# Patient Record
Sex: Female | Born: 1970 | Race: Black or African American | Hispanic: No | Marital: Single | Smoking: Never smoker
Health system: Southern US, Community
[De-identification: ages and names within clinical notes are randomized; demographics above are authoritative.]

## PROBLEM LIST (undated history)

## (undated) DIAGNOSIS — Z8639 Personal history of other endocrine, nutritional and metabolic disease: Secondary | ICD-10-CM

## (undated) DIAGNOSIS — M1990 Unspecified osteoarthritis, unspecified site: Secondary | ICD-10-CM

## (undated) DIAGNOSIS — D649 Anemia, unspecified: Secondary | ICD-10-CM

## (undated) HISTORY — PX: OTHER SURGICAL HISTORY: SHX169

## (undated) HISTORY — DX: Unspecified osteoarthritis, unspecified site: M19.90

## (undated) HISTORY — PX: LAPAROSCOPIC GASTRIC SLEEVE RESECTION: SHX5895

## (undated) HISTORY — DX: Personal history of other endocrine, nutritional and metabolic disease: Z86.39

## (undated) HISTORY — PX: WISDOM TOOTH EXTRACTION: SHX21

---

## 2002-02-22 ENCOUNTER — Emergency Department (HOSPITAL_COMMUNITY): Admission: EM | Admit: 2002-02-22 | Discharge: 2002-02-22 | Payer: Self-pay | Admitting: *Deleted

## 2004-02-07 ENCOUNTER — Other Ambulatory Visit: Admission: RE | Admit: 2004-02-07 | Discharge: 2004-02-07 | Payer: Self-pay | Admitting: Obstetrics & Gynecology

## 2006-01-15 ENCOUNTER — Inpatient Hospital Stay (HOSPITAL_COMMUNITY): Admission: AD | Admit: 2006-01-15 | Discharge: 2006-01-17 | Payer: Self-pay | Admitting: Obstetrics and Gynecology

## 2006-04-30 ENCOUNTER — Emergency Department (HOSPITAL_COMMUNITY): Admission: EM | Admit: 2006-04-30 | Discharge: 2006-04-30 | Payer: Self-pay | Admitting: Emergency Medicine

## 2009-09-20 ENCOUNTER — Encounter (INDEPENDENT_AMBULATORY_CARE_PROVIDER_SITE_OTHER): Payer: Self-pay | Admitting: *Deleted

## 2011-01-02 ENCOUNTER — Emergency Department (INDEPENDENT_AMBULATORY_CARE_PROVIDER_SITE_OTHER): Payer: 59

## 2011-01-02 ENCOUNTER — Emergency Department (HOSPITAL_BASED_OUTPATIENT_CLINIC_OR_DEPARTMENT_OTHER)
Admission: EM | Admit: 2011-01-02 | Discharge: 2011-01-02 | Disposition: A | Discharge status: Home / Self Care | Payer: 59 | Attending: Emergency Medicine | Admitting: Emergency Medicine

## 2011-01-02 DIAGNOSIS — Y93E1 Activity, personal bathing and showering: Secondary | ICD-10-CM | POA: Insufficient documentation

## 2011-01-02 DIAGNOSIS — M25569 Pain in unspecified knee: Secondary | ICD-10-CM

## 2011-01-02 DIAGNOSIS — W010XXA Fall on same level from slipping, tripping and stumbling without subsequent striking against object, initial encounter: Secondary | ICD-10-CM | POA: Insufficient documentation

## 2011-01-02 DIAGNOSIS — Y92009 Unspecified place in unspecified non-institutional (private) residence as the place of occurrence of the external cause: Secondary | ICD-10-CM | POA: Insufficient documentation

## 2011-01-02 DIAGNOSIS — IMO0002 Reserved for concepts with insufficient information to code with codable children: Secondary | ICD-10-CM | POA: Insufficient documentation

## 2012-05-01 IMAGING — CR DG KNEE COMPLETE 4+V*L*
4 series · 4 of 4 positions shown · non-contrast
Comparison: None.

CLINICAL DATA: Fall

LEFT KNEE - COMPLETE 4+ VIEW

[t knee oblique left (1 of 2)]
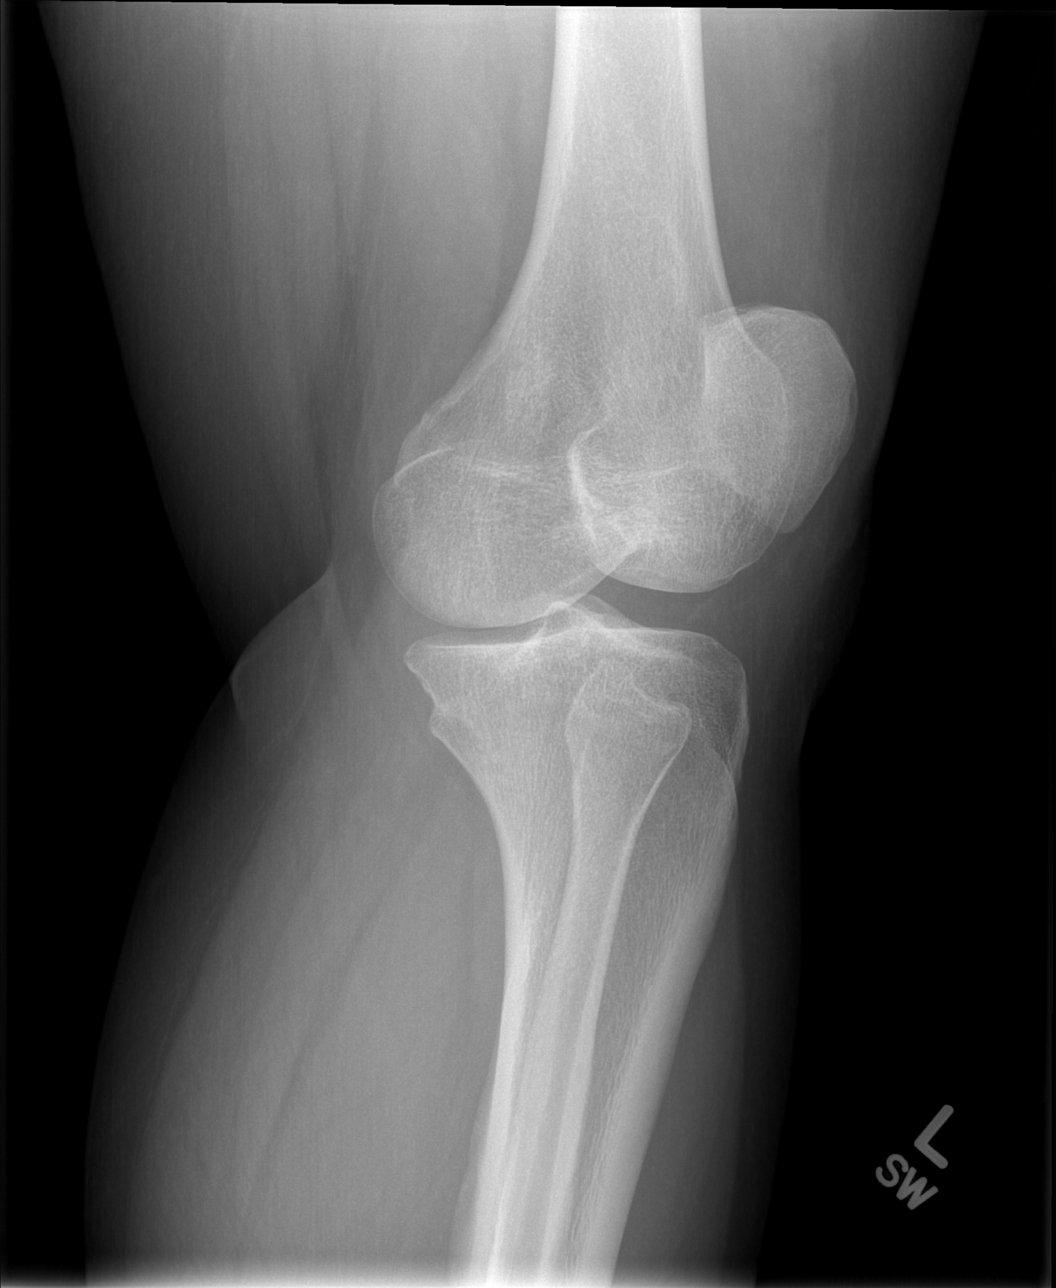

[t knee oblique left (2 of 2)]
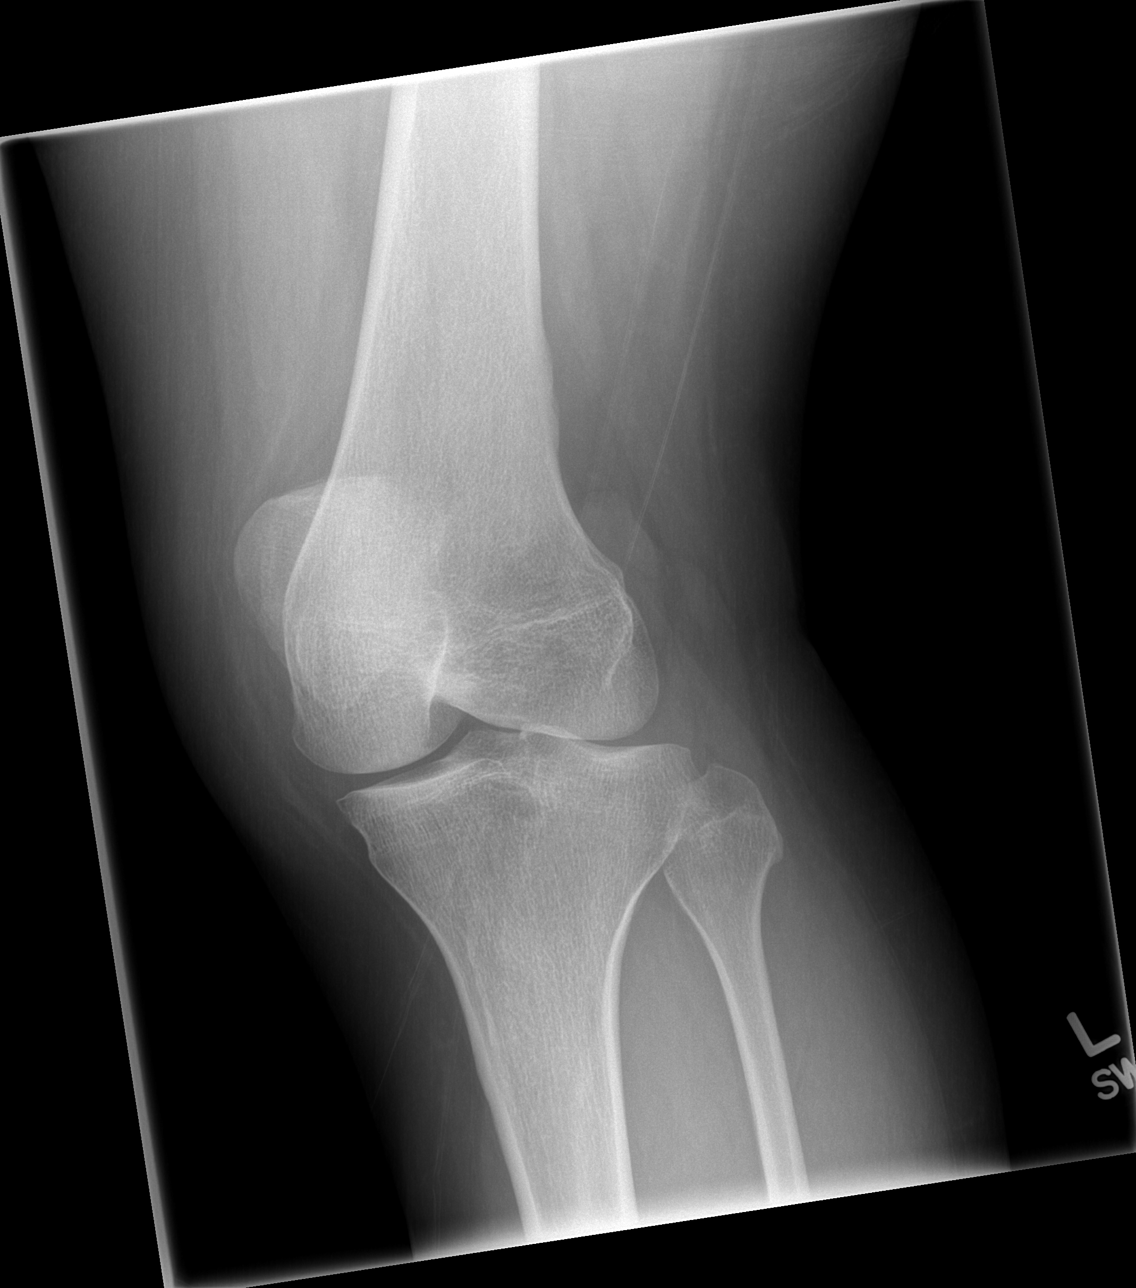

[t knee ap left]
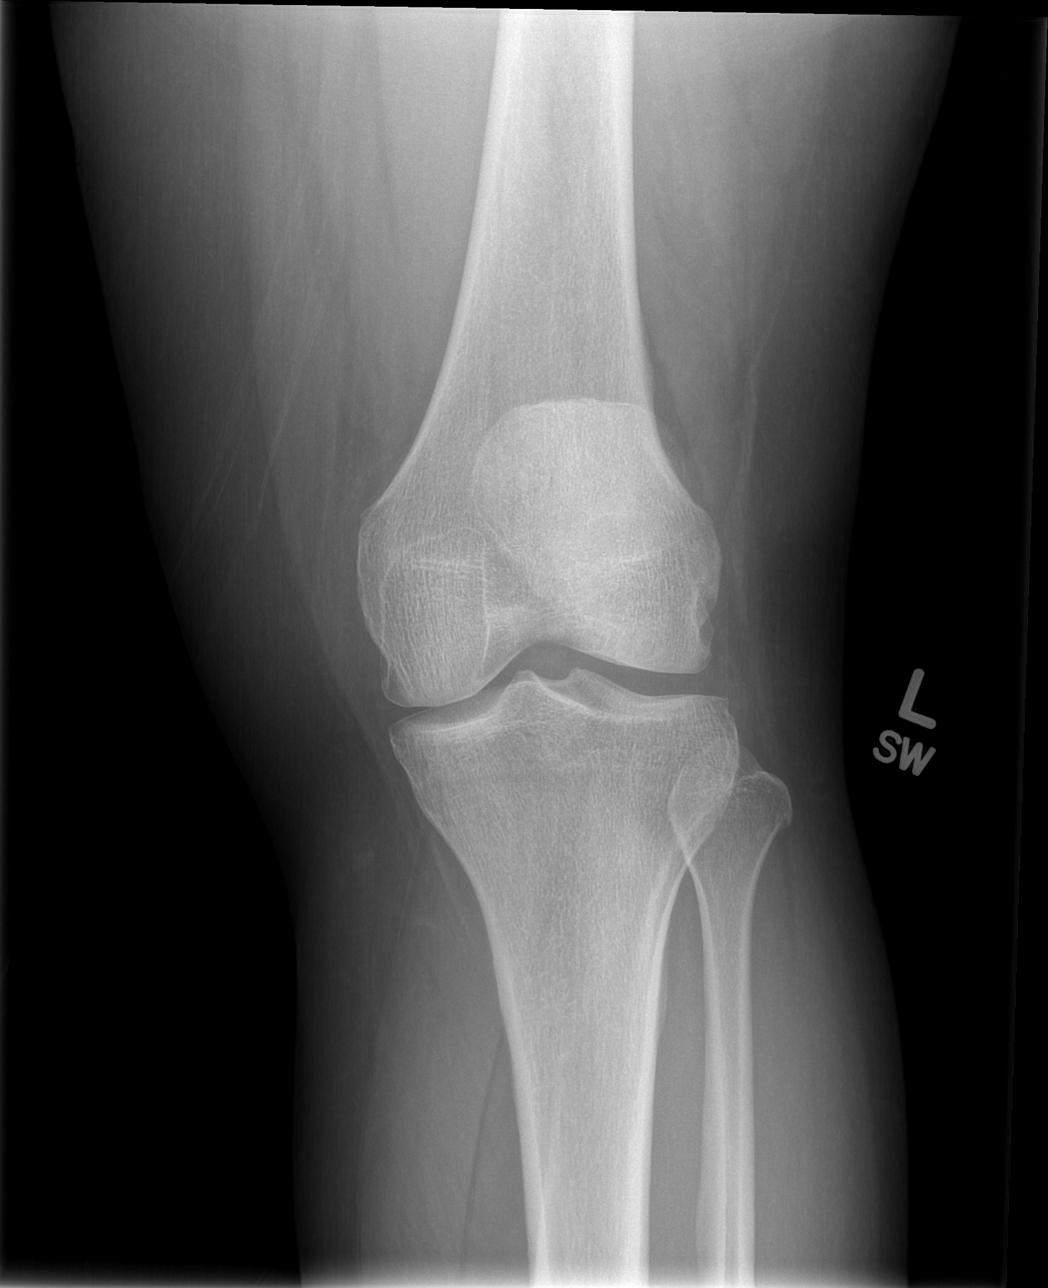

[t knee lat left]
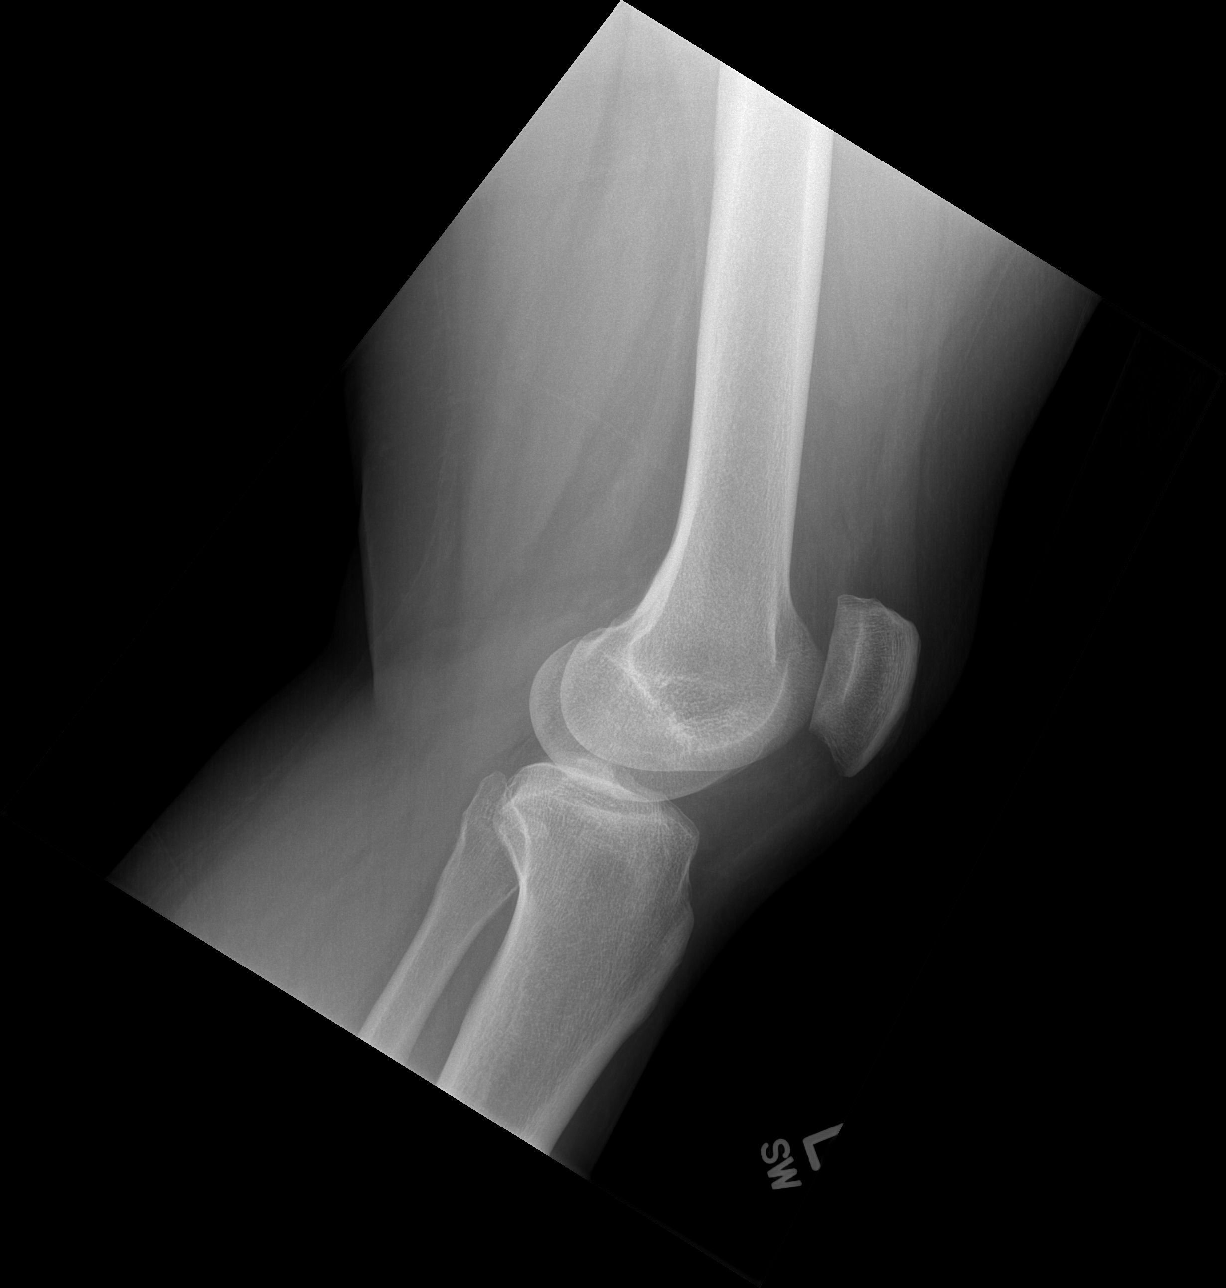

[4 of 4 positions shown; findings below may reference images not displayed]

FINDINGS: The knee is located.  Joint spaces are maintained.  No
fracture, focal bony abnormality, or significant degenerative
change.  No evidence of joint effusion or focal soft tissue
swelling.
IMPRESSION: No acute bony abnormality.

## 2014-06-27 ENCOUNTER — Other Ambulatory Visit: Payer: Self-pay | Admitting: Obstetrics and Gynecology

## 2014-07-01 ENCOUNTER — Encounter (HOSPITAL_COMMUNITY): Payer: Self-pay | Admitting: Pharmacist

## 2014-07-04 ENCOUNTER — Encounter (HOSPITAL_COMMUNITY): Payer: Self-pay | Admitting: *Deleted

## 2014-07-14 ENCOUNTER — Inpatient Hospital Stay (HOSPITAL_COMMUNITY)
Admission: AD | Admit: 2014-07-14 | Discharge: 2014-07-14 | Disposition: A | Discharge status: Home / Self Care | Payer: 59 | Source: Ambulatory Visit | Attending: Obstetrics and Gynecology | Admitting: Obstetrics and Gynecology

## 2014-07-14 ENCOUNTER — Encounter (HOSPITAL_COMMUNITY)
Admission: RE | Disposition: A | Discharge status: Home / Self Care | Payer: Self-pay | Source: Ambulatory Visit | Attending: Obstetrics and Gynecology

## 2014-07-14 ENCOUNTER — Ambulatory Visit (HOSPITAL_COMMUNITY)
Admission: RE | Admit: 2014-07-14 | Discharge: 2014-07-14 | Disposition: A | Discharge status: Home / Self Care | Payer: 59 | Source: Ambulatory Visit | Attending: Obstetrics and Gynecology | Admitting: Obstetrics and Gynecology

## 2014-07-14 ENCOUNTER — Ambulatory Visit (HOSPITAL_COMMUNITY): Payer: 59 | Admitting: Certified Registered Nurse Anesthetist

## 2014-07-14 ENCOUNTER — Encounter (HOSPITAL_COMMUNITY): Payer: Self-pay | Admitting: *Deleted

## 2014-07-14 ENCOUNTER — Encounter (HOSPITAL_COMMUNITY): Payer: 59 | Admitting: Certified Registered Nurse Anesthetist

## 2014-07-14 ENCOUNTER — Encounter (HOSPITAL_COMMUNITY): Payer: Self-pay | Admitting: Anesthesiology

## 2014-07-14 DIAGNOSIS — Z302 Encounter for sterilization: Secondary | ICD-10-CM | POA: Diagnosis present

## 2014-07-14 DIAGNOSIS — Z5309 Procedure and treatment not carried out because of other contraindication: Secondary | ICD-10-CM | POA: Insufficient documentation

## 2014-07-14 DIAGNOSIS — D649 Anemia, unspecified: Secondary | ICD-10-CM | POA: Diagnosis present

## 2014-07-14 DIAGNOSIS — Z9884 Bariatric surgery status: Secondary | ICD-10-CM | POA: Insufficient documentation

## 2014-07-14 DIAGNOSIS — D509 Iron deficiency anemia, unspecified: Secondary | ICD-10-CM | POA: Diagnosis not present

## 2014-07-14 HISTORY — DX: Anemia, unspecified: D64.9

## 2014-07-14 LAB — IRON AND TIBC
IRON: 10 ug/dL — AB (ref 42–135)
Saturation Ratios: 2 % — ABNORMAL LOW (ref 20–55)
TIBC: 492 ug/dL — AB (ref 250–470)
UIBC: 482 ug/dL — ABNORMAL HIGH (ref 125–400)

## 2014-07-14 LAB — CBC
HEMATOCRIT: 23.5 % — AB (ref 36.0–46.0)
HEMATOCRIT: 24.1 % — AB (ref 36.0–46.0)
Hemoglobin: 6.6 g/dL — CL (ref 12.0–15.0)
Hemoglobin: 6.7 g/dL — CL (ref 12.0–15.0)
MCH: 17.7 pg — ABNORMAL LOW (ref 26.0–34.0)
MCH: 17.6 pg — AB (ref 26.0–34.0)
MCHC: 28.1 g/dL — AB (ref 30.0–36.0)
MCHC: 27.8 g/dL — AB (ref 30.0–36.0)
MCV: 63.2 fL — AB (ref 78.0–100.0)
MCV: 63.4 fL — ABNORMAL LOW (ref 78.0–100.0)
Platelets: 390 10*3/uL (ref 150–400)
Platelets: 416 10*3/uL — ABNORMAL HIGH (ref 150–400)
RBC: 3.72 MIL/uL — ABNORMAL LOW (ref 3.87–5.11)
RBC: 3.8 MIL/uL — ABNORMAL LOW (ref 3.87–5.11)
RDW: 18.5 % — ABNORMAL HIGH (ref 11.5–15.5)
RDW: 18.5 % — AB (ref 11.5–15.5)
WBC: 5.4 10*3/uL (ref 4.0–10.5)
WBC: 5.3 10*3/uL (ref 4.0–10.5)

## 2014-07-14 LAB — VITAMIN B12: Vitamin B-12: 288 pg/mL (ref 211–911)

## 2014-07-14 LAB — FERRITIN: Ferritin: <1 ng/mL — ABNORMAL LOW (ref 10–291)

## 2014-07-14 LAB — RETICULOCYTES
RBC.: 3.8 MIL/uL — AB (ref 3.87–5.11)
Retic Count, Absolute: 34.2 10*3/uL (ref 19.0–186.0)
Retic Ct Pct: 0.9 % (ref 0.4–3.1)

## 2014-07-14 LAB — PREGNANCY, URINE: Preg Test, Ur: NEGATIVE

## 2014-07-14 SURGERY — LIGATION, FALLOPIAN TUBE, LAPAROSCOPIC
Anesthesia: General | Laterality: Bilateral

## 2014-07-14 MED ORDER — BUPIVACAINE HCL (PF) 0.25 % IJ SOLN
INTRAMUSCULAR | Status: AC
  Filled 2014-07-14: qty 30

## 2014-07-14 MED ORDER — LACTATED RINGERS IV SOLN: INTRAVENOUS | Status: DC

## 2014-07-14 MED ORDER — SODIUM CHLORIDE 0.9 % IJ SOLN
INTRAMUSCULAR | Status: AC
  Filled 2014-07-14: qty 10

## 2014-07-14 MED ORDER — SODIUM CHLORIDE 0.9 % IV SOLN
INTRAVENOUS | Status: DC
  Administered 2014-07-14: 12:00:00 via INTRAVENOUS

## 2014-07-14 MED ORDER — SODIUM CHLORIDE 0.9 % IV SOLN
1020.0000 mg | Freq: Once | INTRAVENOUS | Status: DC
  Filled 2014-07-14: qty 34

## 2014-07-14 MED ORDER — FENTANYL CITRATE 0.05 MG/ML IJ SOLN
INTRAMUSCULAR | Status: AC
  Filled 2014-07-14: qty 5

## 2014-07-14 MED ORDER — LIDOCAINE HCL (CARDIAC) 20 MG/ML IV SOLN
INTRAVENOUS | Status: AC
  Filled 2014-07-14: qty 5

## 2014-07-14 MED ORDER — GLYCOPYRROLATE 0.2 MG/ML IJ SOLN
INTRAMUSCULAR | Status: AC
  Filled 2014-07-14: qty 2

## 2014-07-14 MED ORDER — SODIUM CHLORIDE 0.9 % IV SOLN
1020.0000 mg | Freq: Once | INTRAVENOUS | Status: AC
  Administered 2014-07-14: 1020 mg via INTRAVENOUS

## 2014-07-14 MED ORDER — MIDAZOLAM HCL 2 MG/2ML IJ SOLN
INTRAMUSCULAR | Status: AC
  Filled 2014-07-14: qty 2

## 2014-07-14 MED ORDER — DEXAMETHASONE SODIUM PHOSPHATE 4 MG/ML IJ SOLN
INTRAMUSCULAR | Status: AC
  Filled 2014-07-14: qty 1

## 2014-07-14 MED ORDER — PROPOFOL 10 MG/ML IV EMUL
INTRAVENOUS | Status: AC
  Filled 2014-07-14: qty 20

## 2014-07-14 MED ORDER — SCOPOLAMINE 1 MG/3DAYS TD PT72: 1.0000 | MEDICATED_PATCH | Freq: Once | TRANSDERMAL | Status: DC

## 2014-07-14 SURGICAL SUPPLY — 17 items
CATH ROBINSON RED A/P 16FR (CATHETERS) IMPLANT
CLOTH BEACON ORANGE TIMEOUT ST (SAFETY) IMPLANT
DRSG COVADERM PLUS 2X2 (GAUZE/BANDAGES/DRESSINGS) IMPLANT
DRSG OPSITE POSTOP 3X4 (GAUZE/BANDAGES/DRESSINGS) IMPLANT
DURAPREP 26ML APPLICATOR (WOUND CARE) IMPLANT
GLOVE BIOGEL PI IND STRL 7.0 (GLOVE) IMPLANT
GLOVE BIOGEL PI INDICATOR 7.0 (GLOVE)
GLOVE ECLIPSE 6.5 STRL STRAW (GLOVE) IMPLANT
GOWN STRL REUS W/TWL LRG LVL3 (GOWN DISPOSABLE) ×6 IMPLANT
NEEDLE INSUFFLATION 120MM (ENDOMECHANICALS) IMPLANT
PACK LAPAROSCOPY BASIN (CUSTOM PROCEDURE TRAY) IMPLANT
SUT VICRYL 0 UR6 27IN ABS (SUTURE) IMPLANT
SUT VICRYL 4-0 PS2 18IN ABS (SUTURE) IMPLANT
TOWEL OR 17X24 6PK STRL BLUE (TOWEL DISPOSABLE) IMPLANT
TROCAR OPTI TIP 5M 100M (ENDOMECHANICALS) IMPLANT
TROCAR XCEL DIL TIP R 11M (ENDOMECHANICALS) IMPLANT
WATER STERILE IRR 1000ML POUR (IV SOLUTION) IMPLANT

## 2014-07-14 NOTE — MAU Note (Signed)
Pt was scheduled for elective sterilization, found to be anemic. Surgery canceled, sent here for iron infusion

## 2014-07-14 NOTE — Progress Notes (Signed)
Pt tolerated iron infusion without reaction V/SS.

## 2014-07-14 NOTE — Anesthesia Preprocedure Evaluation (Deleted)
Anesthesia Evaluation  Patient identified by MRN, date of birth, ID band Patient awake    Reviewed: Allergy & Precautions, H&P , NPO status , Patient's Chart, lab work & pertinent test results  Airway       Dental   Pulmonary neg pulmonary ROS,          Cardiovascular     Neuro/Psych negative neurological ROS  negative psych ROS   GI/Hepatic Hx/o Gastric Bypass   Endo/Other  negative endocrine ROSHx/o MO S/P gastric bypass. Has lost 100lbs  Renal/GU   negative genitourinary   Musculoskeletal negative musculoskeletal ROS (+)   Abdominal   Peds  Hematology  (+) anemia ,   Anesthesia Other Findings   Reproductive/Obstetrics Desires sterilization                         Anesthesia Physical Anesthesia Plan  ASA: III  Anesthesia Plan: General   Post-op Pain Management:    Induction: Intravenous  Airway Management Planned: Oral ETT  Additional Equipment:   Intra-op Plan:   Post-operative Plan: Extubation in OR  Informed Consent: I have reviewed the patients History and Physical, chart, labs and discussed the procedure including the risks, benefits and alternatives for the proposed anesthesia with the patient or authorized representative who has indicated his/her understanding and acceptance.   Dental advisory given  Plan Discussed with: Anesthesiologist, CRNA and Surgeon  Anesthesia Plan Comments: (H/H 6.6/23.5 with microcytic indices. Discussed with Dr. Garwin Brothers. She will cancel case.)       Anesthesia Quick Evaluation

## 2014-07-14 NOTE — Progress Notes (Addendum)
CRITICAL VALUE ALERT  Critical value received HBG 6.7  Date of notification:  07/14/14   Time of notification:  1220  Critical value read back:Yes.    Nurse who received alert:  K.Charlisa Cham,RN  MD notified (1st page):  Sherronett Cousins   Time of first page:    MD notified (2nd page):  Time of second page:  Responding MD:    Time MD responded:

## 2014-07-14 NOTE — H&P (Signed)
Dawn Acevedo is an 43 y.o. female G2P2 SBF presents for permanent sterilization.  Pertinent Gynecological History: Menses: nl Bleeding: normal Contraception: none DES exposure: denies Blood transfusions: none Sexually transmitted diseases: no past history Previous GYN Procedures: n/a  Last mammogram: normal Date: 09/16/2013 Last pap: normal Date: 04/27/2012( HPV neg) OB History: G2, P2   Menstrual History: Menarche age: n/a No LMP recorded.    No past medical history on file.  Past Surgical History  Procedure Laterality Date  . Gastic bypas      No family history on file.  Social History:  reports that she has never smoked. She has never used smokeless tobacco. She reports that she does not drink alcohol or use illicit drugs.  Allergies: No Known Allergies  No prescriptions prior to admission    Review of Systems  All other systems reviewed and are negative.   There were no vitals taken for this visit. Physical Exam  Constitutional: She is oriented to person, place, and time. She appears well-developed and well-nourished.  HENT:  Head: Atraumatic.  Eyes: EOM are normal.  Neck: Neck supple.  Cardiovascular: Normal rate and regular rhythm.   Respiratory: Breath sounds normal.  GI: Soft.  Obese nontender, laparoscopy scar  Musculoskeletal: She exhibits no edema.  Neurological: She is alert and oriented to person, place, and time.  Skin: Skin is warm and dry.  Psychiatric: She has a normal mood and affect.    No results found for this or any previous visit (from the past 24 hour(s)).  No results found.  Assessment/Plan Desires sterilization Term gestation  P) LTL with cautery.. Procedure explained: Risk reviewed infection, bleeding, permanent nonreversible( 1/500-1/600 failure rate), injury to underlying structures, thermal injury  Dawn Acevedo A 07/14/2014, 5:40 AM

## 2014-07-14 NOTE — Progress Notes (Signed)
Pt presented for elective sterilization. Workup showed hgb 6.6 w/ indices consistent with severe microcytic hypochromic anemia. Pt has known gastric bypass which may contribute to malabsorption of iron and other essential vitamins. Pt was unaware of low hgb. She notes cycles lasting 3-4 days but clots on first 2 days Will cancel surgery and do workup of anemia In light of her gastric bypass, pt would benefit from IV iron( 1068mcg). Since she is here, will obtain labs ( ferritin, retic count, tibc, iron indices). Will also do red cell folate , vit B12 level. Pt expects her cycle on Sunday. She will call the office for sono D7-10 of cycle. Office notified

## 2014-07-14 NOTE — Progress Notes (Signed)
  Surgery cancelled by Dr. Garwin Brothers because hgb 6.6   Dr. Garwin Brothers explained to patient why she is cancelling surgery and instructed her to go to MAU to have more labs drawn and receive iron infusion.  Patient verbalizes understanding.  I walked her down to MAU and spoke with charge rn who said it was going to be "a while" so patient is leaving to go get something to eat and will be back around 1100.

## 2014-07-15 LAB — FOLATE RBC: RBC Folate: 677 ng/mL — ABNORMAL HIGH (ref >280)

## 2014-07-15 LAB — VITAMIN D 25 HYDROXY (VIT D DEFICIENCY, FRACTURES): Vit D, 25-Hydroxy: 13 ng/mL — ABNORMAL LOW (ref 30–89)

## 2014-07-22 LAB — VITAMIN D 1,25 DIHYDROXY
VITAMIN D3 1, 25 (OH): 108 pg/mL
Vitamin D 1, 25 (OH)2 Total: 108 pg/mL — ABNORMAL HIGH (ref 18–72)
Vitamin D2 1, 25 (OH)2: <8 pg/mL

## 2014-09-26 ENCOUNTER — Encounter (HOSPITAL_COMMUNITY): Payer: Self-pay | Admitting: *Deleted

## 2014-09-28 ENCOUNTER — Other Ambulatory Visit: Payer: Self-pay | Admitting: Obstetrics and Gynecology

## 2014-09-30 ENCOUNTER — Encounter (HOSPITAL_COMMUNITY): Payer: Self-pay | Admitting: *Deleted

## 2014-10-10 MED ORDER — FENTANYL CITRATE 0.05 MG/ML IJ SOLN: 25.0000 ug | INTRAMUSCULAR | Status: DC | PRN

## 2014-10-10 MED ORDER — MEPERIDINE HCL 25 MG/ML IJ SOLN: 6.2500 mg | INTRAMUSCULAR | Status: DC | PRN

## 2014-10-10 MED ORDER — METOCLOPRAMIDE HCL 5 MG/ML IJ SOLN: 10.0000 mg | Freq: Once | INTRAMUSCULAR | Status: DC | PRN

## 2014-10-10 NOTE — OR Nursing (Signed)
Called and left message for patient to call

## 2014-10-11 ENCOUNTER — Encounter (HOSPITAL_COMMUNITY)
Admission: RE | Disposition: A | Discharge status: Home / Self Care | Payer: Self-pay | Source: Ambulatory Visit | Attending: Obstetrics and Gynecology

## 2014-10-11 ENCOUNTER — Ambulatory Visit (HOSPITAL_COMMUNITY): Payer: 59 | Admitting: Anesthesiology

## 2014-10-11 ENCOUNTER — Encounter (HOSPITAL_COMMUNITY): Payer: Self-pay

## 2014-10-11 ENCOUNTER — Ambulatory Visit (HOSPITAL_COMMUNITY)
Admission: RE | Admit: 2014-10-11 | Discharge: 2014-10-11 | Disposition: A | Discharge status: Home / Self Care | Payer: 59 | Source: Ambulatory Visit | Attending: Obstetrics and Gynecology | Admitting: Obstetrics and Gynecology

## 2014-10-11 DIAGNOSIS — Z9884 Bariatric surgery status: Secondary | ICD-10-CM | POA: Diagnosis not present

## 2014-10-11 DIAGNOSIS — N920 Excessive and frequent menstruation with regular cycle: Secondary | ICD-10-CM | POA: Diagnosis not present

## 2014-10-11 DIAGNOSIS — D508 Other iron deficiency anemias: Secondary | ICD-10-CM | POA: Diagnosis not present

## 2014-10-11 DIAGNOSIS — K660 Peritoneal adhesions (postprocedural) (postinfection): Secondary | ICD-10-CM | POA: Insufficient documentation

## 2014-10-11 DIAGNOSIS — Z302 Encounter for sterilization: Secondary | ICD-10-CM | POA: Diagnosis not present

## 2014-10-11 DIAGNOSIS — Z9889 Other specified postprocedural states: Secondary | ICD-10-CM

## 2014-10-11 HISTORY — PX: DILITATION & CURRETTAGE/HYSTROSCOPY WITH NOVASURE ABLATION: SHX5568

## 2014-10-11 HISTORY — PX: LAPAROSCOPIC TUBAL LIGATION: SHX1937

## 2014-10-11 LAB — PREGNANCY, URINE: PREG TEST UR: NEGATIVE

## 2014-10-11 LAB — CBC
HEMATOCRIT: 38.2 % (ref 36.0–46.0)
Hemoglobin: 12.5 g/dL (ref 12.0–15.0)
MCH: 28.2 pg (ref 26.0–34.0)
MCHC: 32.7 g/dL (ref 30.0–36.0)
MCV: 86 fL (ref 78.0–100.0)
Platelets: 305 10*3/uL (ref 150–400)
RBC: 4.44 MIL/uL (ref 3.87–5.11)
RDW: 17.1 % — ABNORMAL HIGH (ref 11.5–15.5)
WBC: 6.1 10*3/uL (ref 4.0–10.5)

## 2014-10-11 SURGERY — DILATATION & CURETTAGE/HYSTEROSCOPY WITH NOVASURE ABLATION
Anesthesia: General | Site: Uterus

## 2014-10-11 MED ORDER — PROMETHAZINE HCL 25 MG RE SUPP
RECTAL | Status: AC
  Filled 2014-10-11: qty 1

## 2014-10-11 MED ORDER — SUCCINYLCHOLINE CHLORIDE 20 MG/ML IJ SOLN
INTRAMUSCULAR | Status: DC | PRN
  Administered 2014-10-11: 100 mg via INTRAVENOUS

## 2014-10-11 MED ORDER — LIDOCAINE HCL (CARDIAC) 20 MG/ML IV SOLN
INTRAVENOUS | Status: DC | PRN
  Administered 2014-10-11: 80 mg via INTRAVENOUS

## 2014-10-11 MED ORDER — FENTANYL CITRATE 0.05 MG/ML IJ SOLN
INTRAMUSCULAR | Status: AC
  Filled 2014-10-11: qty 5

## 2014-10-11 MED ORDER — HYDROMORPHONE HCL 1 MG/ML IJ SOLN
INTRAMUSCULAR | Status: DC | PRN
  Administered 2014-10-11 (×2): 0.5 mg via INTRAVENOUS

## 2014-10-11 MED ORDER — MEPERIDINE HCL 25 MG/ML IJ SOLN: 6.2500 mg | INTRAMUSCULAR | Status: DC | PRN

## 2014-10-11 MED ORDER — CHLOROPROCAINE HCL 1 % IJ SOLN
INTRAMUSCULAR | Status: AC
  Filled 2014-10-11: qty 30

## 2014-10-11 MED ORDER — ONDANSETRON HCL 4 MG/2ML IJ SOLN
INTRAMUSCULAR | Status: AC
  Filled 2014-10-11: qty 2

## 2014-10-11 MED ORDER — HYDROCODONE-ACETAMINOPHEN 5-325 MG PO TABS: 1.0000 | ORAL_TABLET | Freq: Four times a day (QID) | ORAL | Status: DC | PRN

## 2014-10-11 MED ORDER — 0.9 % SODIUM CHLORIDE (POUR BTL) OPTIME
TOPICAL | Status: DC | PRN
  Administered 2014-10-11: 1000 mL

## 2014-10-11 MED ORDER — HYDROCODONE-ACETAMINOPHEN 5-325 MG PO TABS
ORAL_TABLET | ORAL | Status: AC
  Filled 2014-10-11: qty 1

## 2014-10-11 MED ORDER — BUPIVACAINE HCL (PF) 0.25 % IJ SOLN
INTRAMUSCULAR | Status: DC | PRN
  Administered 2014-10-11: 6 mL

## 2014-10-11 MED ORDER — MIDAZOLAM HCL 2 MG/2ML IJ SOLN: 0.5000 mg | Freq: Once | INTRAMUSCULAR | Status: DC | PRN

## 2014-10-11 MED ORDER — PROPOFOL 10 MG/ML IV EMUL
INTRAVENOUS | Status: AC
  Filled 2014-10-11: qty 20

## 2014-10-11 MED ORDER — FENTANYL CITRATE 0.05 MG/ML IJ SOLN
INTRAMUSCULAR | Status: DC | PRN
  Administered 2014-10-11: 50 ug via INTRAVENOUS
  Administered 2014-10-11 (×2): 100 ug via INTRAVENOUS

## 2014-10-11 MED ORDER — PROPOFOL 10 MG/ML IV BOLUS
INTRAVENOUS | Status: DC | PRN
  Administered 2014-10-11: 200 mg via INTRAVENOUS

## 2014-10-11 MED ORDER — HYDROCODONE-ACETAMINOPHEN 5-325 MG PO TABS
1.0000 | ORAL_TABLET | Freq: Once | ORAL | Status: AC
  Administered 2014-10-11: 1 via ORAL

## 2014-10-11 MED ORDER — SCOPOLAMINE 1 MG/3DAYS TD PT72
1.0000 | MEDICATED_PATCH | TRANSDERMAL | Status: DC
  Administered 2014-10-11: 1 via TRANSDERMAL
  Administered 2014-10-11: 1.5 mg via TRANSDERMAL

## 2014-10-11 MED ORDER — FENTANYL CITRATE 0.05 MG/ML IJ SOLN
25.0000 ug | INTRAMUSCULAR | Status: DC | PRN
  Administered 2014-10-11 (×2): 50 ug via INTRAVENOUS

## 2014-10-11 MED ORDER — NEOSTIGMINE METHYLSULFATE 10 MG/10ML IV SOLN
INTRAVENOUS | Status: DC | PRN
  Administered 2014-10-11: 3 mg via INTRAVENOUS

## 2014-10-11 MED ORDER — SCOPOLAMINE 1 MG/3DAYS TD PT72
MEDICATED_PATCH | TRANSDERMAL | Status: AC
  Administered 2014-10-11: 1.5 mg via TRANSDERMAL
  Filled 2014-10-11: qty 1

## 2014-10-11 MED ORDER — LACTATED RINGERS IV SOLN
INTRAVENOUS | Status: DC | PRN
  Administered 2014-10-11: 3000 mL

## 2014-10-11 MED ORDER — SUCCINYLCHOLINE CHLORIDE 20 MG/ML IJ SOLN
INTRAMUSCULAR | Status: AC
  Filled 2014-10-11: qty 10

## 2014-10-11 MED ORDER — KETOROLAC TROMETHAMINE 30 MG/ML IJ SOLN
INTRAMUSCULAR | Status: AC
  Filled 2014-10-11: qty 1

## 2014-10-11 MED ORDER — KETOROLAC TROMETHAMINE 30 MG/ML IJ SOLN
INTRAMUSCULAR | Status: DC | PRN
  Administered 2014-10-11: 30 mg via INTRAVENOUS
  Administered 2014-10-11: 30 mg via INTRAMUSCULAR

## 2014-10-11 MED ORDER — SODIUM CHLORIDE 0.9 % IJ SOLN
INTRAMUSCULAR | Status: AC
  Filled 2014-10-11: qty 3

## 2014-10-11 MED ORDER — MIDAZOLAM HCL 2 MG/2ML IJ SOLN
INTRAMUSCULAR | Status: DC | PRN
  Administered 2014-10-11: 2 mg via INTRAVENOUS

## 2014-10-11 MED ORDER — NEOSTIGMINE METHYLSULFATE 10 MG/10ML IV SOLN
INTRAVENOUS | Status: AC
  Filled 2014-10-11: qty 1

## 2014-10-11 MED ORDER — ROCURONIUM BROMIDE 100 MG/10ML IV SOLN
INTRAVENOUS | Status: DC | PRN
  Administered 2014-10-11: 5 mg via INTRAVENOUS
  Administered 2014-10-11: 20 mg via INTRAVENOUS

## 2014-10-11 MED ORDER — KETOROLAC TROMETHAMINE 30 MG/ML IJ SOLN: 15.0000 mg | Freq: Once | INTRAMUSCULAR | Status: DC | PRN

## 2014-10-11 MED ORDER — GLYCOPYRROLATE 0.2 MG/ML IJ SOLN
INTRAMUSCULAR | Status: DC | PRN
  Administered 2014-10-11: 0.4 mg via INTRAVENOUS
  Administered 2014-10-11: 0.2 mg via INTRAVENOUS

## 2014-10-11 MED ORDER — LIDOCAINE HCL (CARDIAC) 20 MG/ML IV SOLN
INTRAVENOUS | Status: AC
  Filled 2014-10-11: qty 5

## 2014-10-11 MED ORDER — DEXAMETHASONE SODIUM PHOSPHATE 10 MG/ML IJ SOLN
INTRAMUSCULAR | Status: DC | PRN
  Administered 2014-10-11: 4 mg via INTRAVENOUS

## 2014-10-11 MED ORDER — HYDROMORPHONE HCL 1 MG/ML IJ SOLN
INTRAMUSCULAR | Status: AC
  Filled 2014-10-11: qty 1

## 2014-10-11 MED ORDER — PROMETHAZINE HCL 25 MG/ML IJ SOLN
6.2500 mg | INTRAMUSCULAR | Status: DC | PRN
  Administered 2014-10-11: 6.25 mg via INTRAVENOUS

## 2014-10-11 MED ORDER — BUPIVACAINE HCL (PF) 0.25 % IJ SOLN
INTRAMUSCULAR | Status: AC
  Filled 2014-10-11: qty 30

## 2014-10-11 MED ORDER — GLYCOPYRROLATE 0.2 MG/ML IJ SOLN
INTRAMUSCULAR | Status: AC
Start: 2014-10-11 — End: 2014-10-11
  Filled 2014-10-11: qty 3

## 2014-10-11 MED ORDER — MIDAZOLAM HCL 2 MG/2ML IJ SOLN
INTRAMUSCULAR | Status: AC
  Filled 2014-10-11: qty 2

## 2014-10-11 MED ORDER — PROMETHAZINE HCL 25 MG/ML IJ SOLN
INTRAMUSCULAR | Status: DC
Start: 2014-10-11 — End: 2014-10-11
  Filled 2014-10-11: qty 1

## 2014-10-11 MED ORDER — FENTANYL CITRATE 0.05 MG/ML IJ SOLN
INTRAMUSCULAR | Status: AC
  Filled 2014-10-11: qty 2

## 2014-10-11 MED ORDER — PROMETHAZINE HCL 25 MG RE SUPP
25.0000 mg | RECTAL | Status: DC | PRN
  Administered 2014-10-11: 25 mg via RECTAL

## 2014-10-11 MED ORDER — ONDANSETRON HCL 4 MG/2ML IJ SOLN
INTRAMUSCULAR | Status: DC | PRN
  Administered 2014-10-11: 4 mg via INTRAVENOUS

## 2014-10-11 MED ORDER — ROCURONIUM BROMIDE 100 MG/10ML IV SOLN
INTRAVENOUS | Status: AC
  Filled 2014-10-11: qty 1

## 2014-10-11 MED ORDER — LACTATED RINGERS IV SOLN
INTRAVENOUS | Status: DC
  Administered 2014-10-11: 11:00:00 via INTRAVENOUS
  Administered 2014-10-11: 125 mL/h via INTRAVENOUS

## 2014-10-11 SURGICAL SUPPLY — 29 items
ABLATOR ENDOMETRIAL BIPOLAR (ABLATOR) ×4 IMPLANT
CATH ROBINSON RED A/P 16FR (CATHETERS) ×4 IMPLANT
CLOTH BEACON ORANGE TIMEOUT ST (SAFETY) ×4 IMPLANT
CONTAINER PREFILL 10% NBF 60ML (FORM) IMPLANT
DRSG COVADERM PLUS 2X2 (GAUZE/BANDAGES/DRESSINGS) ×4 IMPLANT
DRSG OPSITE POSTOP 3X4 (GAUZE/BANDAGES/DRESSINGS) ×4 IMPLANT
DURAPREP 26ML APPLICATOR (WOUND CARE) ×4 IMPLANT
GLOVE BIO SURGEON STRL SZ 6.5 (GLOVE) ×3 IMPLANT
GLOVE BIO SURGEONS STRL SZ 6.5 (GLOVE) ×1
GLOVE BIOGEL PI IND STRL 6.5 (GLOVE) ×4 IMPLANT
GLOVE BIOGEL PI IND STRL 7.0 (GLOVE) ×6 IMPLANT
GLOVE BIOGEL PI INDICATOR 6.5 (GLOVE) ×4
GLOVE BIOGEL PI INDICATOR 7.0 (GLOVE) ×6
GLOVE ECLIPSE 6.5 STRL STRAW (GLOVE) ×4 IMPLANT
GOWN STRL REUS W/TWL LRG LVL3 (GOWN DISPOSABLE) ×12 IMPLANT
LIQUID BAND (GAUZE/BANDAGES/DRESSINGS) ×4 IMPLANT
NEEDLE INSUFFLATION 120MM (ENDOMECHANICALS) ×4 IMPLANT
PACK LAPAROSCOPY BASIN (CUSTOM PROCEDURE TRAY) ×4 IMPLANT
PACK VAGINAL MINOR WOMEN LF (CUSTOM PROCEDURE TRAY) ×4 IMPLANT
PAD OB MATERNITY 4.3X12.25 (PERSONAL CARE ITEMS) ×4 IMPLANT
PAD TRENDELENBURG OR TABLE (MISCELLANEOUS) ×4 IMPLANT
SET TUBING HYSTEROSCOPY 2 NDL (TUBING) ×4 IMPLANT
SUT VICRYL 0 UR6 27IN ABS (SUTURE) ×4 IMPLANT
SUT VICRYL 4-0 PS2 18IN ABS (SUTURE) ×4 IMPLANT
TOWEL OR 17X24 6PK STRL BLUE (TOWEL DISPOSABLE) ×8 IMPLANT
TRAY FOLEY CATH 14FR (SET/KITS/TRAYS/PACK) ×4 IMPLANT
TROCAR OPTI TIP 5M 100M (ENDOMECHANICALS) ×4 IMPLANT
TROCAR XCEL DIL TIP R 11M (ENDOMECHANICALS) ×4 IMPLANT
WATER STERILE IRR 1000ML POUR (IV SOLUTION) ×4 IMPLANT

## 2014-10-11 NOTE — Transfer of Care (Signed)
Immediate Anesthesia Transfer of Care Note  Patient: CIANA SIMMON  Procedure(s) Performed: Procedure(s): LAPAROSCOPIC BILATERAL TUBAL LIGATION with Bipolar Cautery (Bilateral) DILATATION & CURETTAGE/HYSTEROSCOPY WITH NOVASURE ABLATION (N/A)  Patient Location: PACU  Anesthesia Type:General  Level of Consciousness: oriented  Airway & Oxygen Therapy: Patient Spontanous Breathing and Patient connected to nasal cannula oxygen  Post-op Assessment: Report given to PACU RN, Post -op Vital signs reviewed and stable and Patient moving all extremities X 4  Post vital signs: Reviewed and stable  Complications: No apparent anesthesia complications

## 2014-10-11 NOTE — Discharge Instructions (Signed)
Warm heat to abdomen every 4 hours x 24hrs.  CALL  IF TEMP>100.4, NOTHING PER VAGINA X 2 WK, CALL IF SOAKING A MAXI  PAD EVERY HOUR OR MORE FREQUENTLY  DISCHARGE INSTRUCTIONS: Laparoscopy  No Ibuprofen containing products (ie Advil, Aleve, Motrin, etc.) until after 5:30 pm tonight.  The following instructions have been prepared to help you care for yourself upon your return home today.  Wound care:  Do not get the incision wet for the first 24 hours. The incision should be kept clean and dry.  The upper abdominal dressing may be removed after 48 hours. The lower abdominal dressing may be removed the day after surgery.  Should the incision become sore, red, and swollen after the first week, check with your doctor.  Personal hygiene:  Shower the day after your procedure.  Activity and limitations:  Do NOT drive or operate any equipment today.  Do NOT lift anything more than 15 pounds for 2-3 weeks after surgery.  Do NOT rest in bed all day.  Walking is encouraged. Walk each day, starting slowly with 5-minute walks 3 or 4 times a day. Slowly increase the length of your walks.  Walk up and down stairs slowly.  Do NOT do strenuous activities, such as golfing, playing tennis, bowling, running, biking, weight lifting, gardening, mowing, or vacuuming for 2-4 weeks. Ask your doctor when it is okay to start.  Diet: Eat a light meal as desired this evening. You may resume your usual diet tomorrow.  Return to work: This is dependent on the type of work you do. For the most part you can return to a desk job within a week of surgery. If you are more active at work, please discuss this with your doctor.  What to expect after your surgery: You may have a slight burning sensation when you urinate on the first day. You may have a very small amount of blood in the urine. Expect to have a small amount of vaginal discharge/light bleeding for 1-2 weeks. It is not unusual to have abdominal soreness  and bruising for up to 2 weeks. You may be tired and need more rest for about 1 week. You may experience shoulder pain for 24-72 hours. Lying flat in bed may relieve it.  Call your doctor for any of the following:  Develop a fever of 100.4 or greater  Inability to urinate 6 hours after discharge from hospital  Severe pain not relieved by pain medications  Persistent of heavy bleeding at incision site  Redness or swelling around incision site after a week  Increasing nausea or vomiting  Patient Signature________________________________________ Nurse Signature_________________________________________

## 2014-10-11 NOTE — Anesthesia Preprocedure Evaluation (Signed)
Anesthesia Evaluation  Patient identified by MRN, date of birth, ID band Patient awake    Reviewed: Allergy & Precautions, H&P , Patient's Chart, lab work & pertinent test results, reviewed documented beta blocker date and time   History of Anesthesia Complications Negative for: history of anesthetic complications  Airway Mallampati: II  TM Distance: >3 FB Neck ROM: full    Dental   Pulmonary  breath sounds clear to auscultation        Cardiovascular Exercise Tolerance: Good Rhythm:regular Rate:Normal     Neuro/Psych    GI/Hepatic   Endo/Other    Renal/GU      Musculoskeletal   Abdominal   Peds  Hematology  (+) anemia ,   Anesthesia Other Findings   Reproductive/Obstetrics                             Anesthesia Physical Anesthesia Plan  ASA: I  Anesthesia Plan: General ETT   Post-op Pain Management:    Induction:   Airway Management Planned:   Additional Equipment:   Intra-op Plan:   Post-operative Plan:   Informed Consent: I have reviewed the patients History and Physical, chart, labs and discussed the procedure including the risks, benefits and alternatives for the proposed anesthesia with the patient or authorized representative who has indicated his/her understanding and acceptance.   Dental Advisory Given  Plan Discussed with: CRNA and Surgeon  Anesthesia Plan Comments:        Anesthesia Quick Evaluation  

## 2014-10-11 NOTE — H&P (Signed)
Dawn Acevedo is an 43 y.o. female. G2P2 BF presents for laparoscopic tubal ligation as well as novasure endometrial ablation due to desires permanent sterilization and management of menorrhagia with associated iron deficiency anemia. Hx notable for gastric bypass  Pertinent Gynecological History: Menses: menorrhagia Bleeding: menorrhagia Contraception: none DES exposure: denies Blood transfusions: none Sexually transmitted diseases: no past history Previous GYN Procedures: none  Last mammogram: normal Date: 09/16/2013 Last pap: normal Date: 04/27/2012 OB History: G2, P2   Menstrual History: Menarche age: none  No LMP recorded.    Past Medical History  Diagnosis Date  . Anemia     Past Surgical History  Procedure Laterality Date  . Gastic bypas      History reviewed. No pertinent family history.  Social History:  reports that she has never smoked. She has never used smokeless tobacco. She reports that she does not drink alcohol or use illicit drugs.  Allergies: No Known Allergies  No prescriptions prior to admission    Review of Systems  All other systems reviewed and are negative.   There were no vitals taken for this visit. Physical Exam  Constitutional: She is oriented to person, place, and time. She appears well-developed and well-nourished.  HENT:  Head: Atraumatic.  Eyes: EOM are normal.  Neck: Neck supple.  Cardiovascular: Normal rate.   Respiratory: Breath sounds normal.  GI: Soft.  Musculoskeletal: Normal range of motion.  Neurological: She is alert and oriented to person, place, and time.  Skin: Skin is warm and dry.  Psychiatric: She has a normal mood and affect.  vulva nl  vagina nl Uterus AV sl enlarged Adnexa no palp mass   No results found for this or any previous visit (from the past 24 hour(s)).  No results found.  Assessment/Plan: Menorhagia Desires sterilization P) LTL w/ cautery, novasure endometrial ablation Procedures  explained. Risk of surgery reviewed including infection, bleeding, failure 1/500-1/600, nonreversible, permanent sterilization. 90% reduction in flow, 10% failure, poss hematometra , poss hydrosalpinx. All ? answered Shatori Bertucci A 10/11/2014, 7:41 AM

## 2014-10-11 NOTE — Brief Op Note (Signed)
10/11/2014  11:40 AM  PATIENT:  Dawn Acevedo  43 y.o. female  PRE-OPERATIVE DIAGNOSIS:  Desires Sterilization, Menorrhagia, Anemia(Iron deficiency  POST-OPERATIVE DIAGNOSIS:  Desires Sterilization, Menorrhagia, Anemia(IDA)  PROCEDURE:  Diagnostic hysteroscopy, Novasure endometrial ablation, LTL with bipolar cautery  SURGEON:  Surgeon(s) and Role:    * Linh Hedberg A Birl Lobello, MD - Primary  PHYSICIAN ASSISTANT:   ASSISTANTS: none   ANESTHESIA:   general FINDINGS: nl tubes and ovaries, no endometriosis, nl endom cavity, nl uterus, nl liver edge EBL:  Total I/O In: 1300 [I.V.:1300] Out: 25 [Urine:25]  BLOOD ADMINISTERED:none  DRAINS: none   LOCAL MEDICATIONS USED:  MARCAINE     SPECIMEN:  No Specimen  DISPOSITION OF SPECIMEN:  N/A  COUNTS:  YES  TOURNIQUET:  * No tourniquets in log *  DICTATION: .Other Dictation: Dictation Number [id number]  PLAN OF CARE: Discharge to home after PACU  PATIENT DISPOSITION:  PACU - hemodynamically stable.   Delay start of Pharmacological VTE agent (>24hrs) due to surgical blood loss or risk of bleeding: no

## 2014-10-11 NOTE — Anesthesia Postprocedure Evaluation (Signed)
  Anesthesia Post Note  Patient: Dawn Acevedo  Procedure(s) Performed: Procedure(s) (LRB): LAPAROSCOPIC BILATERAL TUBAL LIGATION with Bipolar Cautery (Bilateral) DILATATION & CURETTAGE/HYSTEROSCOPY WITH NOVASURE ABLATION (N/A)  Anesthesia type: GA  Patient location: PACU  Post pain: Pain level controlled  Post assessment: Post-op Vital signs reviewed  Last Vitals:  Filed Vitals:   10/11/14 1215  BP: 146/75  Pulse: 52  Temp:   Resp: 19    Post vital signs: Reviewed  Level of consciousness: sedated  Complications: No apparent anesthesia complications

## 2014-10-12 ENCOUNTER — Encounter (HOSPITAL_COMMUNITY): Payer: Self-pay | Admitting: Obstetrics and Gynecology

## 2014-10-12 NOTE — Op Note (Deleted)
NAMEHALEY, FUERSTENBERG NO.:  [id number]  MEDICAL RECORD NO.:  [id number]  LOCATION:                                 FACILITY:  PHYSICIAN:  Servando Salina, M.D.DATE OF BIRTH:  Nov 25, 1971  DATE OF PROCEDURE:  10/11/2014 DATE OF DISCHARGE:  10/11/2014                              OPERATIVE REPORT   PREOPERATIVE DIAGNOSES:  Desires sterilization, menorrhagia, iron- deficiency anemia.  PROCEDURE:  Laparoscopic tubal ligation with bipolar cautery, diagnostic hysteroscopy, NovaSure endometrial ablation.  POSTOPERATIVE DIAGNOSES:  Desires sterilization, menorrhagia, iron- deficiency anemia.  ANESTHESIA:  General.  SURGEON:  Servando Salina, M.D.  ASSISTANT:  None.  PROCEDURE:  Under adequate general anesthesia, the patient was placed in the dorsal lithotomy position.  She was sterilely prepped and draped in usual fashion.  Examination under anesthesia revealed anteverted uterus. No adnexal masses could be appreciated.  An indwelling Foley catheter was placed.  Bivalve speculum was placed in the vagina.  Single-tooth tenaculum was placed on the anterior lip of the cervix.  The uterus sounded to 9 cm.  The endocervical canal sounded to 3 cm making the uterine cavity length of 6 cm.  The cervix was then gently dilated and a diagnostic hysteroscope was inserted.  Both tubal ostia could be seen. No lesions were noted.  The hysteroscope was removed.  The NovaSure endometrial apparatus was then inserted.  However, the cavity width would not come out of the red zone.  The apparatus was removed.  The uterus was then re-sounded to 8.5 cm with a width.  The endocervical canal length of 3 making the cervical uterine length of 5.5.  At that point, based on those measurements, the NovaSure apparatus was then inserted and the uterine weight of 4 cm was achieved.  Using a power of 106, 65 minutes of endometrial ablation occurred, the apparatus was then removed.  The  hysteroscope was then inserted and inspected.  The cavity was inspected with good ablation noted throughout.  At that point, the ablation was felt to be completed.  Acorn cannula was then introduced into the uterine cavity and attached tenaculum for manipulation of the uterus.  The bivalve speculum was removed.  Attention was then turned to the abdomen.  0.25% Marcaine was injected infraumbilically and a vertical infraumbilical incision was then made.  Veress needle was introduced, tested with normal saline.  3-1/2 L of CO2 was insufflated. Veress needle was then removed.  A 10-mm disposable trocar with sleeve was introduced into the abdomen without incident.  A lighted video laparoscope was inserted.  Atraumatic entry into the abdomen was noted. Normal liver edge was noted.  Gallbladder was partially seen.  Attention was then turned to the pelvis.  The patient was placed in Trendelenburg position.  Some adhesions in the posterior cul-de-sac were noted.  Both tubes were noted to be normal.  Both ovaries were normal.  Uterus was normal.  No evidence of endometriosis was noted.  The suprapubic incision was then made.  A 5-mm port was placed under direct visualization.  The pelvis was further inspected.  No other findings were noted.  Using the bipolar cautery, midportion of both fallopian tubes were cauterized for at  least a centimeter of length.  The procedure was then felt to be adequate.  A 5-mm port was removed.  The port sites were inspected.  The abdomen was deflated and infraumbilical site was removed under direct visualization taking care not to bring up any underlying structures.  Once this was done, the fascia was identified in the infraumbilical site.  A 0 Vicryl figure-of-eight suture was then placed and the skin incisions were closed with 4-0 Vicryl subcuticular sutures.  SPECIMEN:  None.  ESTIMATED BLOOD LOSS:  Less than 20 mL.  COMPLICATION:  None.  The patient  tolerated the procedure well, was transferred to recovery room in stable condition.     Servando Salina, M.D.     [place]/MEDQ  D:  10/11/2014  T:  10/12/2014  Job:  [id number]

## 2014-10-12 NOTE — Op Note (Signed)
Dawn Acevedo, Dawn Acevedo NO.:  [id number]  MEDICAL RECORD NO.:  [id number]  LOCATION:                                 FACILITY:  PHYSICIAN:  Servando Salina, M.D.DATE OF BIRTH:  1971/10/08  DATE OF PROCEDURE:  10/11/2014 DATE OF DISCHARGE:  10/11/2014                              OPERATIVE REPORT   PREOPERATIVE DIAGNOSES:  Desires sterilization, menorrhagia, iron- deficiency anemia.  PROCEDURE:  Laparoscopic tubal ligation with bipolar cautery, diagnostic hysteroscopy, NovaSure endometrial ablation.  POSTOPERATIVE DIAGNOSES:  Desires sterilization, menorrhagia, iron- deficiency anemia.  ANESTHESIA:  General.  SURGEON:  Servando Salina, M.D.  ASSISTANT:  None.  PROCEDURE:  Under adequate general anesthesia, the patient was placed in the dorsal lithotomy position.  She was sterilely prepped and draped in usual fashion.  Examination under anesthesia revealed anteverted uterus. No adnexal masses could be appreciated.  An indwelling Foley catheter was placed.  Bivalve speculum was placed in the vagina.  Single-tooth tenaculum was placed on the anterior lip of the cervix.  The uterus sounded to 9 cm.  The endocervical canal sounded to 3 cm making the uterine cavity length of 6 cm.  The cervix was then gently dilated and a diagnostic hysteroscope was inserted.  Both tubal ostia could be seen. No lesions were noted.  The hysteroscope was removed.  The NovaSure endometrial apparatus was then inserted.  However, the cavity width would not come out of the red zone.  The apparatus was removed.  The uterus was then re-sounded to 8.5 cm with a width.  The endocervical canal length of 3 making the cervical uterine length of 5.5.  At that point, based on those measurements, the NovaSure apparatus was then inserted and the uterine weight of 4 cm was achieved.  Using a power of 106, 65 minutes of endometrial ablation occurred, the Novasure apparatus was then removed.   The hysteroscope was then inserted and the cavity inspected. Excellent endometrial ablation was noted throughout.  At that point, the ablation was felt to be complete.  Acorn cannula was then introduced into the uterine cavity and attached tenaculum for manipulation of the uterus.  The bivalve speculum was removed.  Attention was then turned to the abdomen.  0.25% Marcaine was injected infraumbilically and a vertical infraumbilical incision was then made.  Veress needle was introduced, tested with normal saline.  3-1/2 L of CO2 was insufflated. Veress needle was then removed.  A 10-mm disposable trocar with sleeve was introduced into the abdomen without incident.  A lighted video laparoscope was inserted.  Atraumatic entry into the abdomen was noted. Normal liver edge was noted.  Gallbladder was partially seen.  Attention was then turned to the pelvis.  The patient was placed in Trendelenburg position.  Some adhesions in the posterior cul-de-sac were noted.  Both tubes were noted to be normal.  Both ovaries were normal.  Uterus was normal.  No evidence of endometriosis was noted.  The suprapubic incision was then made.  A 5-mm port was placed under direct visualization.  The pelvis was further inspected.  No other findings were noted.  Using the bipolar cautery, midportion of both fallopian tubes were cauterized for at least  a centimeter of length.  The procedure was then felt to be adequate.  A 5-mm port was removed.  The port sites were inspected.  The abdomen was deflated and infraumbilical site was removed under direct visualization taking care not to bring up any underlying structures.  Once this was done, the fascia was identified in the infraumbilical site.  A 0 Vicryl figure-of-eight suture was then placed in the fascia and the skin incisions were closed with 4-0 Vicryl subcuticular sutures.  SPECIMEN:  None.  ESTIMATED BLOOD LOSS:  Less than 20 mL. Intraoperative fluid: 1300  ml Urine output: 25 ml COMPLICATION:  None. CONDITION: Stable  The patient tolerated the procedure well, was transferred to recovery room in stable condition.     Servando Salina, M.D.     /MEDQ  D:  10/11/2014  T:  10/12/2014  Job:  [id number]

## 2014-12-08 ENCOUNTER — Encounter (HOSPITAL_COMMUNITY): Payer: Self-pay | Admitting: Obstetrics and Gynecology

## 2015-05-04 ENCOUNTER — Encounter (HOSPITAL_COMMUNITY): Payer: Self-pay | Admitting: Obstetrics and Gynecology

## 2018-03-30 DIAGNOSIS — J40 Bronchitis, not specified as acute or chronic: Secondary | ICD-10-CM | POA: Diagnosis not present

## 2018-03-30 DIAGNOSIS — J0100 Acute maxillary sinusitis, unspecified: Secondary | ICD-10-CM | POA: Diagnosis not present

## 2018-04-17 DIAGNOSIS — Z131 Encounter for screening for diabetes mellitus: Secondary | ICD-10-CM | POA: Diagnosis not present

## 2018-04-17 DIAGNOSIS — E559 Vitamin D deficiency, unspecified: Secondary | ICD-10-CM | POA: Diagnosis not present

## 2018-04-17 DIAGNOSIS — Z1329 Encounter for screening for other suspected endocrine disorder: Secondary | ICD-10-CM | POA: Diagnosis not present

## 2018-04-17 DIAGNOSIS — Z13220 Encounter for screening for lipoid disorders: Secondary | ICD-10-CM | POA: Diagnosis not present

## 2018-05-18 DIAGNOSIS — Z01419 Encounter for gynecological examination (general) (routine) without abnormal findings: Secondary | ICD-10-CM | POA: Diagnosis not present

## 2018-05-25 DIAGNOSIS — Z131 Encounter for screening for diabetes mellitus: Secondary | ICD-10-CM | POA: Diagnosis not present

## 2018-05-25 DIAGNOSIS — Z0000 Encounter for general adult medical examination without abnormal findings: Secondary | ICD-10-CM | POA: Diagnosis not present

## 2018-05-25 DIAGNOSIS — Z01118 Encounter for examination of ears and hearing with other abnormal findings: Secondary | ICD-10-CM | POA: Diagnosis not present

## 2018-09-05 DIAGNOSIS — Z23 Encounter for immunization: Secondary | ICD-10-CM | POA: Diagnosis not present

## 2018-09-17 DIAGNOSIS — Z1231 Encounter for screening mammogram for malignant neoplasm of breast: Secondary | ICD-10-CM | POA: Diagnosis not present

## 2018-12-23 DIAGNOSIS — Z719 Counseling, unspecified: Secondary | ICD-10-CM | POA: Diagnosis not present

## 2019-01-19 DIAGNOSIS — Z719 Counseling, unspecified: Secondary | ICD-10-CM | POA: Diagnosis not present

## 2019-05-12 ENCOUNTER — Telehealth: Payer: Self-pay | Admitting: Orthopedic Surgery

## 2019-05-12 NOTE — Telephone Encounter (Signed)
Patient wanted an appointment for today for knee pain. I asked her if she had been seen by her PCP or the ER she stated no she just wanted an appointment. I told her that we didn't have anything available until next week. Stated she wanted something today and hung up.

## 2020-02-04 ENCOUNTER — Ambulatory Visit: Payer: Self-pay | Attending: Internal Medicine

## 2020-02-04 DIAGNOSIS — Z23 Encounter for immunization: Secondary | ICD-10-CM

## 2020-02-04 NOTE — Progress Notes (Signed)
   Covid-19 Vaccination Clinic  Name:  Dawn Acevedo    MRN: [id number] DOB: November 08, 1971  02/04/2020  [name] was observed post Covid-19 immunization for 15 minutes without incident. She was provided with Vaccine Information Sheet and instruction to access the V-Safe system.   [name] was instructed to call 911 with any severe reactions post vaccine: Marland Kitchen Difficulty breathing  . Swelling of face and throat  . A fast heartbeat  . A bad rash all over body  . Dizziness and weakness   Immunizations Administered    Name Date Dose VIS Date Route   Pfizer COVID-19 Vaccine 02/04/2020 10:51 AM 0.3 mL 11/05/2019 Intramuscular   Manufacturer: [place]   Lot: [id number]   [place]: [id number]

## 2020-02-05 ENCOUNTER — Ambulatory Visit: Payer: Self-pay

## 2020-02-10 ENCOUNTER — Ambulatory Visit: Payer: Self-pay

## 2020-02-28 ENCOUNTER — Ambulatory Visit: Payer: Self-pay | Attending: Internal Medicine

## 2020-02-28 DIAGNOSIS — Z23 Encounter for immunization: Secondary | ICD-10-CM

## 2020-02-28 NOTE — Progress Notes (Signed)
   Covid-19 Vaccination Clinic  Name:  Dawn Acevedo    MRN: [id number] DOB: February 24, 1971  02/28/2020  [name] was observed post Covid-19 immunization for 15 minutes without incident. She was provided with Vaccine Information Sheet and instruction to access the V-Safe system.   [name] was instructed to call 911 with any severe reactions post vaccine: Marland Kitchen Difficulty breathing  . Swelling of face and throat  . A fast heartbeat  . A bad rash all over body  . Dizziness and weakness   Immunizations Administered    Name Date Dose VIS Date Route   Pfizer COVID-19 Vaccine 02/28/2020  2:27 PM 0.3 mL 11/05/2019 Intramuscular   Manufacturer: [place]   Lot: [id number]   [place]: [id number]

## 2020-05-25 DIAGNOSIS — K912 Postsurgical malabsorption, not elsewhere classified: Secondary | ICD-10-CM | POA: Insufficient documentation

## 2020-06-30 ENCOUNTER — Encounter: Payer: Self-pay | Admitting: Internal Medicine

## 2020-08-09 ENCOUNTER — Telehealth: Payer: Self-pay

## 2020-08-09 NOTE — Telephone Encounter (Signed)
Left message to call back. Advised in msg that pt needs a consult visit first to discuss EGD as well per referring provider ov notes in Epic from 07/19/20. Colon procedure and PV cancelled per Robin.

## 2020-08-09 NOTE — Telephone Encounter (Signed)
-----   Message from Levonne Spiller, RN sent at 08/09/2020 11:47 AM EDT ----- Yes I will. Im sorry ----- Message ----- From: Elna Breslow Sent: 08/09/2020  11:29 AM EDT To: Levonne Spiller, RN  Nash Dimmer   I am working on the 4th floor now could you direct this to a scheduler.  Maybe Cathy.  Thank you  Lia  ----- Message ----- From: Levonne Spiller, RN Sent: 08/09/2020   9:57 AM EDT To: Othelia Pulling. Per PCP OV notes this patient needs to be evaluated for EGD and colon due to Iron def. anemai. It sounds like she needs an OV with APP or MD to discuss this. We can not schedule the EGD in PV and as of now she only has a colon scheduled. Please call her an make her this appointment. Thank you

## 2020-08-14 NOTE — Telephone Encounter (Signed)
Made OV appointment with the patient to see Jessica,PA.

## 2020-08-21 ENCOUNTER — Encounter (HOSPITAL_COMMUNITY): Payer: Self-pay

## 2020-08-21 ENCOUNTER — Emergency Department (HOSPITAL_COMMUNITY)
Admission: EM | Admit: 2020-08-21 | Discharge: 2020-08-21 | Disposition: A | Discharge status: Home / Self Care | Payer: 59 | Attending: Emergency Medicine | Admitting: Emergency Medicine

## 2020-08-21 ENCOUNTER — Other Ambulatory Visit: Payer: Self-pay

## 2020-08-21 DIAGNOSIS — X58XXXA Exposure to other specified factors, initial encounter: Secondary | ICD-10-CM | POA: Diagnosis not present

## 2020-08-21 DIAGNOSIS — T1592XA Foreign body on external eye, part unspecified, left eye, initial encounter: Secondary | ICD-10-CM | POA: Diagnosis present

## 2020-08-21 MED ORDER — ERYTHROMYCIN 5 MG/GM OP OINT
1.0000 | TOPICAL_OINTMENT | Freq: Once | OPHTHALMIC | Status: AC
Start: 2020-08-21 — End: 2020-08-21
  Administered 2020-08-21: 1 via OPHTHALMIC
  Filled 2020-08-21: qty 3.5

## 2020-08-21 MED ORDER — FLUORESCEIN SODIUM 1 MG OP STRP
1.0000 | ORAL_STRIP | Freq: Once | OPHTHALMIC | Status: AC
  Administered 2020-08-21: 1 via OPHTHALMIC
  Filled 2020-08-21: qty 1

## 2020-08-21 MED ORDER — TETRACAINE HCL 0.5 % OP SOLN
1.0000 [drp] | Freq: Once | OPHTHALMIC | Status: AC
  Administered 2020-08-21: 1 [drp] via OPHTHALMIC
  Filled 2020-08-21: qty 4

## 2020-08-21 NOTE — ED Triage Notes (Signed)
Pt accidentally put drops of super glue in left eye instead of eye drops.  Denies pain but says eye feels irritated.  Pt has dried glue in eye lashes.

## 2020-08-21 NOTE — Discharge Instructions (Signed)
Please apply the erythromycin ointment approximately 1/4-1/2 an inch ribbon to your left eye and eyelashes 3 times a day.  This will help to moisten up the glue and help it to come out quicker, it should come out within 10 days at the longest.  If you are developing increasing or worsening pain or redness to your eyeball changes in vision then you should return to the emergency department or your eye doctor for repeat exam.  Otherwise have your eye doctor check you in 48 hours

## 2020-08-21 NOTE — ED Provider Notes (Signed)
[facility] EMERGENCY DEPARTMENT Provider Note   CSN: [id number] Arrival date & time: 08/21/20  4332     History Chief Complaint  Patient presents with  . Foreign Body in [place] is a 49 y.o. female.  HPI   49 year old female accidentally applied superglue to her left eye instead of eyedrops that she was given by her ophthalmologist for "inflamed eye".  This occurred this morning, she recognized that something was abnormal the way that it felt so she stopped doing it immediately.  Since that time she has noticed a slight redness to her eye though she does endorse that this redness was the whole reason she went to the optometrist in the first place and not a result of the superglue.  She has a slight inflamed feeling of the eye with a slight blurred vision but states that she is not wearing her corrective lenses.  No discharge drainage or swelling of the eye, the glue is mostly in her upper eyelashes.  Past Medical History:  Diagnosis Date  . Anemia     There are no problems to display for this patient.   Past Surgical History:  Procedure Laterality Date  . DILITATION & CURRETTAGE/HYSTROSCOPY WITH NOVASURE ABLATION N/A 10/11/2014   Procedure: DILATATION & CURETTAGE/HYSTEROSCOPY WITH NOVASURE ABLATION;  Surgeon: Marvene Staff, MD;  Location: [facility];  Service: Gynecology;  Laterality: N/A;  . gastic bypas    . LAPAROSCOPIC TUBAL LIGATION Bilateral 10/11/2014   Procedure: LAPAROSCOPIC BILATERAL TUBAL LIGATION with Bipolar Cautery;  Surgeon: Marvene Staff, MD;  Location: [facility];  Service: Gynecology;  Laterality: Bilateral;     OB History    Gravida  2   Para  2   Term  2   Preterm      AB      Living  2     SAB      TAB      Ectopic      Multiple      Live Births              No family history on file.  Social History   Tobacco Use  . Smoking status: Never Smoker  . Smokeless tobacco: Never Used  Substance Use Topics    . Alcohol use: No  . Drug use: No    Home Medications Prior to Admission medications   Medication Sig Start Date End Date Taking? Authorizing Provider  acetaminophen (TYLENOL) 500 MG tablet Take 1,000 mg by mouth every 6 (six) hours as needed for headache.    [provider]  Fe-Succ-C-Thre-B12-Des Stomach (MULTIGEN PO) Take 1 tablet by mouth daily.    [provider]  flintstones complete (FLINTSTONES) 60 MG chewable tablet Chew 2 tablets by mouth daily.    [provider]  HYDROcodone-acetaminophen (NORCO/VICODIN) 5-325 MG per tablet Take 1 tablet by mouth every 6 (six) hours as needed for moderate pain. 10/11/14   Servando Salina, MD  loratadine (CLARITIN) 10 MG tablet Take 10 mg by mouth daily as needed for allergies.    [provider]  Vitamin D, Ergocalciferol, (DRISDOL) 50000 UNITS CAPS capsule Take 50,000 Units by mouth every 7 (seven) days. Takes on Monday    [provider]    Allergies    Patient has no known allergies.  Review of Systems   Review of Systems  Constitutional: Negative for fever.  Eyes: Positive for redness and visual disturbance.    Physical Exam  Updated Vital Signs BP (!) 148/87 (BP Location: Left Arm)   Pulse (!) 50   Temp 98 F (36.7 C) (Oral)   Resp 18   Ht 1.702 m (5\' 7" )   Wt 96.2 kg   SpO2 99%   BMI 33.20 kg/m   Physical Exam Vitals and nursing note reviewed.  Constitutional:      Appearance: She is well-developed. She is not diaphoretic.  HENT:     Head: Normocephalic and atraumatic.  Eyes:     General:        Right eye: No discharge.        Left eye: No discharge.     Conjunctiva/sclera: Conjunctivae normal.     Comments: Slight conjunctival hemorrhage on the left at the 3 o'clock position, dried white crusted tissue adhesive in the upper eyelashes, no obvious defect to the surface of the eye on visual inspection, normal extraocular movements, normal pupillary movement and reaction  to light  Pulmonary:     Effort: Pulmonary effort is normal. No respiratory distress.  Skin:    General: Skin is warm and dry.     Findings: No erythema or rash.  Neurological:     Mental Status: She is alert.     Coordination: Coordination normal.     ED Results / Procedures / Treatments   Labs (all labs ordered are listed, but only abnormal results are displayed) Labs Reviewed - No data to display  EKG None  Radiology No results found.  Procedures Procedures (including critical care time)  Medications Ordered in ED Medications  tetracaine (PONTOCAINE) 0.5 % ophthalmic solution 1 drop (1 drop Both Eyes Given by Other 08/21/20 0730)  fluorescein ophthalmic strip 1 strip (1 strip Both Eyes Given by Other 08/21/20 0730)  erythromycin ophthalmic ointment 1 application (1 application Left Eye Given 08/21/20 0730)    ED Course  I have reviewed the triage vital signs and the nursing notes.  Pertinent labs & imaging results that were available during my care of the patient were reviewed by me and considered in my medical decision making (see chart for details).    MDM Rules/Calculators/A&P                          Formal eye exam: Under tetracaine and fluorescein exam the patient's eye was examined with Wood's lamp and the slit-lamp.  There is no fluorescein uptake on the surface of the cornea under high magnification.  There is no signs of foreign body, the pupil has a normal reflex, there is no cells and flare in the anterior chamber.  My impression is that this patient has a superglue buildup in her eyelashes but likely avoided her eyeball.  At this time she will be given erythromycin ointment to use both on the eye and the lashes, she can follow-up with ophthalmology, no other significant treatment needed, she feels comfortable with the plan  Final Clinical Impression(s) / ED Diagnoses Final diagnoses:  Eye foreign bodies, left, initial encounter    Rx / DC Orders ED  Discharge Orders    None       Noemi Chapel, MD 08/21/20 [phone]

## 2020-08-29 ENCOUNTER — Encounter: Payer: Self-pay | Admitting: Internal Medicine

## 2020-09-12 DIAGNOSIS — E538 Deficiency of other specified B group vitamins: Secondary | ICD-10-CM | POA: Insufficient documentation

## 2020-09-14 ENCOUNTER — Encounter: Payer: Self-pay | Admitting: Gastroenterology

## 2020-09-14 ENCOUNTER — Ambulatory Visit (INDEPENDENT_AMBULATORY_CARE_PROVIDER_SITE_OTHER): Payer: 59 | Admitting: Gastroenterology

## 2020-09-14 VITALS — BP 132/74 | HR 80 | Ht 68.0 in | Wt 215.2 lb

## 2020-09-14 DIAGNOSIS — D649 Anemia, unspecified: Secondary | ICD-10-CM

## 2020-09-14 DIAGNOSIS — Z1211 Encounter for screening for malignant neoplasm of colon: Secondary | ICD-10-CM | POA: Diagnosis not present

## 2020-09-14 MED ORDER — SUTAB 1479-225-188 MG PO TABS: 1.0000 | ORAL_TABLET | Freq: Once | ORAL | 0 refills | Status: AC

## 2020-09-14 NOTE — Progress Notes (Signed)
09/14/2020 Dawn Acevedo [id number] 31-Jul-1971   HISTORY OF PRESENT ILLNESS: This is a 49 year old female who is new to our office.  She tells me that she was told that she should be seen by GI to have an EGD and colonoscopy.  She says she is never had a colonoscopy in the past.  She reports that she was previously anemic.  Upon review of labs it looks like back in early June her hemoglobin was in the 10 g range.  She received an iron infusion and repeat hemoglobin at the end of August was 12.1 g.  She denies any dark or bloody stools.  She says that in July she was having abdominal pain, but that has since resolved.  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  She is following with gynecology.  Her only GI complaint is that she will have occasional episodes of feeling very full and bloated and sometimes vomiting if she overeats.  She is status post sleeve gastrectomy in 2012.  Otherwise she feels well from a GI standpoint.   Past Medical History:  Diagnosis Date  . Anemia   . Arthritis    Past Surgical History:  Procedure Laterality Date  . DILITATION & CURRETTAGE/HYSTROSCOPY WITH NOVASURE ABLATION N/A 10/11/2014   Procedure: DILATATION & CURETTAGE/HYSTEROSCOPY WITH NOVASURE ABLATION;  Surgeon: Marvene Staff, MD;  Location: [facility];  Service: Gynecology;  Laterality: N/A;  . LAPAROSCOPIC GASTRIC SLEEVE RESECTION     gastric sleeve  . LAPAROSCOPIC TUBAL LIGATION Bilateral 10/11/2014   Procedure: LAPAROSCOPIC BILATERAL TUBAL LIGATION with Bipolar Cautery;  Surgeon: Marvene Staff, MD;  Location: [facility];  Service: Gynecology;  Laterality: Bilateral;    reports that she has never smoked. She has never used smokeless tobacco. She reports that she does not drink alcohol and does not use drugs. family history includes Diabetes in her father; Heart attack in her father and  mother; Heart disease in her mother; Hypertension in her father and mother; Ovarian cancer in her maternal grandmother; Prostate cancer in her father. Allergies  Allergen Reactions  . Folic Acid Hives      Outpatient Encounter Medications as of 09/14/2020  Medication Sig  . acetaminophen (TYLENOL) 500 MG tablet Take 1,000 mg by mouth every 6 (six) hours as needed for headache.  . cyanocobalamin 1000 MCG tablet Take 1 tablet by mouth as needed.  Marland Kitchen Fe-Succ-C-Thre-B12-Des Stomach (MULTIGEN PO) Take 1 tablet by mouth daily.  . flintstones complete (FLINTSTONES) 60 MG chewable tablet Chew 2 tablets by mouth daily.  . Vitamin D, Ergocalciferol, (DRISDOL) 50000 UNITS CAPS capsule Take 50,000 Units by mouth every 7 (seven) days. Takes on Monday  . [DISCONTINUED] HYDROcodone-acetaminophen (NORCO/VICODIN) 5-325 MG per tablet Take 1 tablet by mouth every 6 (six) hours as needed for moderate pain.  . [DISCONTINUED] loratadine (CLARITIN) 10 MG tablet Take 10 mg by mouth daily as needed for allergies.   No facility-administered encounter medications on file as of 09/14/2020.    REVIEW OF SYSTEMS  : All other systems reviewed and negative except where noted in the History of Present Illness.   PHYSICAL EXAM: BP 132/74   Pulse 80   Ht 5\' 8"  (1.727 m)   Wt 215 lb 3.2 oz (97.6 kg)   SpO2 96%   BMI 32.72 kg/m  General: Well developed AA female in no acute distress  Head: Normocephalic and atraumatic Eyes:  Sclerae anicteric, conjunctiva pink. Ears: Normal auditory acuity Lungs: Clear throughout to auscultation; no W/R/R. Heart: Regular rate and rhythm; no M/R/G. Abdomen: Soft, non-distended.  BS present.  Non-tender. Rectal:  Will be done at the time of colonoscopy. Musculoskeletal: Symmetrical with no gross deformities  Skin: No lesions on visible extremities Extremities: No edema  Neurological: Alert oriented x 4, grossly non-focal Psychological:  Alert and cooperative. Normal mood and  affect  ASSESSMENT AND PLAN: *Previous anemia with low MCV in early June.  She received iron infusion and follow-up hemoglobin in August was normal at 12.1 g with a normal MCV.  She denies any sign of overt GI bleeding with dark or bloody stools. *Screening for colorectal cancer: Never had colonoscopy in the past. *History of sleeve gastrectomy with intermittent episodes of bloating and vomiting if she overeats.  **We will plan for both EGD and colonoscopy with Dr. Henrene Pastor at the patient's request.  The risks, benefits, and alternatives to EGD and colonoscopy were discussed with the patient and she consents to proceed.    CC:  No ref. provider found

## 2020-09-14 NOTE — Patient Instructions (Addendum)
If you are age 50 or older, your body mass index should be between 23-30. Your Body mass index is 32.72 kg/m. If this is out of the aforementioned range listed, please consider follow up with your Primary Care Provider.  If you are age 87 or younger, your body mass index should be between 19-25. Your Body mass index is 32.72 kg/m. If this is out of the aformentioned range listed, please consider follow up with your Primary Care Provider.   Will call in November to schedule procedure.

## 2020-09-15 ENCOUNTER — Encounter: Payer: Self-pay | Admitting: Gastroenterology

## 2020-09-15 DIAGNOSIS — Z0000 Encounter for general adult medical examination without abnormal findings: Secondary | ICD-10-CM | POA: Insufficient documentation

## 2020-09-15 DIAGNOSIS — D649 Anemia, unspecified: Secondary | ICD-10-CM | POA: Insufficient documentation

## 2020-09-15 DIAGNOSIS — Z1211 Encounter for screening for malignant neoplasm of colon: Secondary | ICD-10-CM | POA: Insufficient documentation

## 2020-09-16 NOTE — Progress Notes (Signed)
Noted  

## 2020-09-22 ENCOUNTER — Encounter: Payer: Self-pay | Admitting: Gastroenterology

## 2020-10-11 ENCOUNTER — Telehealth: Payer: Self-pay | Admitting: *Deleted

## 2020-10-11 NOTE — Telephone Encounter (Signed)
-----   Message from Dawn Acevedo, [place] sent at 09/14/2020  4:13 PM EDT ----- Call patient to schedule ECL with Dr. Henrene Pastor in Jan.

## 2020-10-11 NOTE — Telephone Encounter (Signed)
Called patient but unable to leave voicemail as mailbox is full.

## 2020-10-18 ENCOUNTER — Encounter: Payer: Self-pay | Admitting: *Deleted

## 2020-10-23 ENCOUNTER — Encounter: Payer: Self-pay | Admitting: *Deleted

## 2020-12-22 ENCOUNTER — Telehealth: Payer: Self-pay | Admitting: Hematology and Oncology

## 2020-12-22 NOTE — Telephone Encounter (Signed)
Received a new hem referral from [facility] for IDA. Ms. Butrick has been cld and scheduled to see Dr. Lindi Adie on 2/10 at 34pm. Pt aware to arrive 20 minutes early.

## 2021-01-02 ENCOUNTER — Telehealth: Payer: Self-pay | Admitting: Hematology and Oncology

## 2021-01-02 NOTE — Telephone Encounter (Signed)
I cld and left Ms. Kidd a vm to inform her we need to reschedule her appt w/Dr. Lindi Adie  That's scheduled on 2/10

## 2021-01-03 NOTE — Progress Notes (Signed)
[facility] VIRTUAL CONSULT NOTE  Patient Care Team: Ivy Lynn, NP as PCP - General (Nurse Practitioner)  CHIEF COMPLAINTS/PURPOSE OF CONSULTATION:  History of iron deficiency anemia  HISTORY OF PRESENTING ILLNESS:  Dawn Acevedo 50 y.o. 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 She was referred by [facility]. She is currently taking B12 and folate supplements and has received IV iron treatment. Labs on 07/19/20 showed: Hg 12.1, HCT 37.6, ferritin 221.7, folate 6.0, B-12 230. She presents over MyChart today for initial evaluation and discussion of treatment options.  When she runs low in iron, she usually develops symptoms of PICA. She hasnt had these symptoms recently. Last IV Iron infusion was in June 2021. She complains of knee arthritis.  I reviewed her records extensively and collaborated the history with the patient.  MEDICAL HISTORY:  Past Medical History:  Diagnosis Date  . Anemia   . Arthritis     SURGICAL HISTORY: Past Surgical History:  Procedure Laterality Date  . DILITATION & CURRETTAGE/HYSTROSCOPY WITH NOVASURE ABLATION N/A 10/11/2014   Procedure: DILATATION & CURETTAGE/HYSTEROSCOPY WITH NOVASURE ABLATION;  Surgeon: Marvene Staff, MD;  Location: [facility];  Service: Gynecology;  Laterality: N/A;  . LAPAROSCOPIC GASTRIC SLEEVE RESECTION     gastric sleeve  . LAPAROSCOPIC TUBAL LIGATION Bilateral 10/11/2014   Procedure: LAPAROSCOPIC BILATERAL TUBAL LIGATION with Bipolar Cautery;  Surgeon: Marvene Staff, MD;  Location: [facility];  Service: Gynecology;  Laterality: Bilateral;    SOCIAL HISTORY: Social History   Socioeconomic History  . Marital status: Single    Spouse name: Not on file  . Number of children: Not on file  . Years of education: Not on file  . Highest education level: Not on file  Occupational History  . Not on file  Tobacco Use   . Smoking status: Never Smoker  . Smokeless tobacco: Never Used  Vaping Use  . Vaping Use: Never used  Substance and Sexual Activity  . Alcohol use: No  . Drug use: No  . Sexual activity: Not on file  Other Topics Concern  . Not on file  Social History Narrative  . Not on file   Social Determinants of Health   Financial Resource Strain: Not on file  Food Insecurity: Not on file  Transportation Needs: Not on file  Physical Activity: Not on file  Stress: Not on file  Social Connections: Not on file  Intimate Partner Violence: Not on file    FAMILY HISTORY: Family History  Problem Relation Age of Onset  . Heart disease Mother   . Heart attack Mother   . Hypertension Mother   . Heart attack Father   . Diabetes Father   . Hypertension Father   . Prostate cancer Father   . Ovarian cancer Maternal Grandmother   . Colon cancer Neg Hx   . Stomach cancer Neg Hx   . Esophageal cancer Neg Hx   . Liver disease Neg Hx   . Pancreatic cancer Neg Hx     ALLERGIES:  is allergic to folic acid.  MEDICATIONS:  Current Outpatient Medications  Medication Sig Dispense Refill  . acetaminophen (TYLENOL) 500 MG tablet Take 1,000 mg by mouth every 6 (six) hours as needed for headache.    . cyanocobalamin 1000 MCG tablet Take 1 tablet by mouth as needed.    Marland Kitchen Fe-Succ-C-Thre-B12-Des Stomach (MULTIGEN PO) Take 1 tablet by mouth  daily.    . flintstones complete (FLINTSTONES) 60 MG chewable tablet Chew 2 tablets by mouth daily.    . Vitamin D, Ergocalciferol, (DRISDOL) 50000 UNITS CAPS capsule Take 50,000 Units by mouth every 7 (seven) days. Takes on Monday     No current facility-administered medications for this visit.    REVIEW OF SYSTEMS:    All other systems were reviewed with the patient and are negative.  PHYSICAL EXAMINATION: ECOG PERFORMANCE STATUS: 1 - Symptomatic but completely ambulatory  Vitals:   01/04/21 1553  BP: 130/89  Pulse: 68  Resp: 17  Temp: 97.9 F (36.6  C)  SpO2: 100%   Filed Weights   01/04/21 1553  Weight: 220 lb (99.8 kg)    LABORATORY DATA:  I have reviewed the data as listed Lab Results  Component Value Date   WBC 6.6 01/04/2021   HGB 13.0 01/04/2021   HCT 38.6 01/04/2021   MCV 96.0 01/04/2021   PLT 261 01/04/2021   No results found for: NA, K, CL, CO2  RADIOGRAPHIC STUDIES: I have personally reviewed the radiological reports and agreed with the findings in the report.  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 She was referred by [facility]. Current Treatment: B12 and folate supplements  Prior IV iron treatment: June 2021   Labs on 07/19/20 showed: Hg 12.1, HCT 37.6, ferritin 221.7 (June 2021: 6), folate 6.0, B-12 230.  Plan: Recheck labs today and decide if she needs IV Iron No complaints of pica. Based on Hb and MCV, I don't suspect that she will need IV Iron . I will call her tomorrow to discuss the results  F/U in 6 months labs and follow up day after to discuss the result virtually.  All questions were answered. The patient knows to call the clinic with any problems, questions or concerns.   Rulon Eisenmenger, MD, MPH 01/04/2021    I, Molly Dorshimer, am acting as scribe for Nicholas Lose, MD.  I have reviewed the above documentation for accuracy and completeness, and I agree with the above.

## 2021-01-04 ENCOUNTER — Inpatient Hospital Stay: Payer: PRIVATE HEALTH INSURANCE

## 2021-01-04 ENCOUNTER — Inpatient Hospital Stay: Payer: PRIVATE HEALTH INSURANCE | Attending: Hematology and Oncology | Admitting: Hematology and Oncology

## 2021-01-04 ENCOUNTER — Other Ambulatory Visit: Payer: Self-pay

## 2021-01-04 ENCOUNTER — Other Ambulatory Visit: Payer: Self-pay | Admitting: Hematology and Oncology

## 2021-01-04 ENCOUNTER — Telehealth: Payer: Self-pay | Admitting: Hematology and Oncology

## 2021-01-04 DIAGNOSIS — Z833 Family history of diabetes mellitus: Secondary | ICD-10-CM | POA: Diagnosis not present

## 2021-01-04 DIAGNOSIS — Z79899 Other long term (current) drug therapy: Secondary | ICD-10-CM | POA: Diagnosis not present

## 2021-01-04 DIAGNOSIS — D509 Iron deficiency anemia, unspecified: Secondary | ICD-10-CM

## 2021-01-04 DIAGNOSIS — Z9884 Bariatric surgery status: Secondary | ICD-10-CM | POA: Diagnosis not present

## 2021-01-04 DIAGNOSIS — Z8249 Family history of ischemic heart disease and other diseases of the circulatory system: Secondary | ICD-10-CM | POA: Insufficient documentation

## 2021-01-04 DIAGNOSIS — K909 Intestinal malabsorption, unspecified: Secondary | ICD-10-CM | POA: Insufficient documentation

## 2021-01-04 DIAGNOSIS — D508 Other iron deficiency anemias: Secondary | ICD-10-CM | POA: Insufficient documentation

## 2021-01-04 DIAGNOSIS — E538 Deficiency of other specified B group vitamins: Secondary | ICD-10-CM

## 2021-01-04 LAB — CBC WITH DIFFERENTIAL (CANCER CENTER ONLY)
Abs Immature Granulocytes: 0.02 10*3/uL (ref 0.00–0.07)
Basophils Absolute: 0 10*3/uL (ref 0.0–0.1)
Basophils Relative: 1 %
Eosinophils Absolute: 0.2 10*3/uL (ref 0.0–0.5)
Eosinophils Relative: 3 %
HCT: 38.6 % (ref 36.0–46.0)
Hemoglobin: 13 g/dL (ref 12.0–15.0)
Immature Granulocytes: 0 %
Lymphocytes Relative: 34 %
Lymphs Abs: 2.3 10*3/uL (ref 0.7–4.0)
MCH: 32.3 pg (ref 26.0–34.0)
MCHC: 33.7 g/dL (ref 30.0–36.0)
MCV: 96 fL (ref 80.0–100.0)
Monocytes Absolute: 0.6 10*3/uL (ref 0.1–1.0)
Monocytes Relative: 9 %
Neutro Abs: 3.6 10*3/uL (ref 1.7–7.7)
Neutrophils Relative %: 53 %
Platelet Count: 261 10*3/uL (ref 150–400)
RBC: 4.02 MIL/uL (ref 3.87–5.11)
RDW: 12.1 % (ref 11.5–15.5)
WBC Count: 6.6 10*3/uL (ref 4.0–10.5)
nRBC: 0 % (ref 0.0–0.2)

## 2021-01-04 LAB — FOLATE: Folate: 4.5 ng/mL — ABNORMAL LOW (ref >5.9)

## 2021-01-04 LAB — VITAMIN B12: Vitamin B-12: 123 pg/mL — ABNORMAL LOW (ref 180–914)

## 2021-01-04 NOTE — Telephone Encounter (Signed)
Scheduled appts per 2/10 los. Gave pt a print out of AVS.

## 2021-01-04 NOTE — Assessment & Plan Note (Signed)
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 She was referred by [facility]. Current Treatment: B12 and folate supplements  Prior IV iron treatment: June 2021   Labs on 07/19/20 showed: Hg 12.1, HCT 37.6, ferritin 221.7 (June 2021: 6), folate 6.0, B-12 230.  Plan: Recheck labs today and decide if she needs IV Iron

## 2021-01-05 LAB — IRON AND TIBC
Iron: 76 ug/dL (ref 41–142)
Saturation Ratios: 31 % (ref 21–57)
TIBC: 241 ug/dL (ref 236–444)
UIBC: 165 ug/dL (ref 120–384)

## 2021-01-05 LAB — FERRITIN: Ferritin: 228 ng/mL (ref 11–307)

## 2021-01-09 ENCOUNTER — Encounter: Payer: Self-pay | Admitting: Nurse Practitioner

## 2021-01-09 ENCOUNTER — Other Ambulatory Visit: Payer: Self-pay

## 2021-01-09 ENCOUNTER — Ambulatory Visit (INDEPENDENT_AMBULATORY_CARE_PROVIDER_SITE_OTHER): Payer: No Typology Code available for payment source | Admitting: Nurse Practitioner

## 2021-01-09 VITALS — BP 119/65 | HR 55 | Temp 97.9°F | Ht 67.5 in | Wt 219.8 lb

## 2021-01-09 DIAGNOSIS — R21 Rash and other nonspecific skin eruption: Secondary | ICD-10-CM | POA: Diagnosis not present

## 2021-01-09 MED ORDER — CLOTRIMAZOLE 1 % EX CREA
1.0000 | TOPICAL_CREAM | Freq: Two times a day (BID) | CUTANEOUS | 0 refills | Status: DC
Start: 2021-01-09 — End: 2021-04-04

## 2021-01-09 MED ORDER — NYSTATIN 100000 UNIT/GM EX POWD: 1.0000 "application " | Freq: Three times a day (TID) | CUTANEOUS | 0 refills | Status: DC

## 2021-01-09 NOTE — Progress Notes (Signed)
Established Patient Office Visit  Subjective:  Patient ID: Dawn Acevedo, female    DOB: December 04, 1970  Age: 50 y.o. MRN: [id number]  CC:  Chief Complaint  Patient presents with  . Rash    HPI [name] presents for Rash This is a recurrent problem. The current episode started more than 1 month ago. The problem has been gradually worsening since onset. The affected locations include the abdomen (Right/left thigh ). The rash is characterized by dryness and itchiness. Pertinent negatives include no anorexia or fever. Treatments tried: corn stach powder. The treatment provided no relief. There is no history of asthma, eczema or varicella.    Past Medical History:  Diagnosis Date  . Anemia   . Arthritis     Past Surgical History:  Procedure Laterality Date  . DILITATION & CURRETTAGE/HYSTROSCOPY WITH NOVASURE ABLATION N/A 10/11/2014   Procedure: DILATATION & CURETTAGE/HYSTEROSCOPY WITH NOVASURE ABLATION;  Surgeon: Marvene Staff, MD;  Location: [facility];  Service: Gynecology;  Laterality: N/A;  . LAPAROSCOPIC GASTRIC SLEEVE RESECTION     gastric sleeve  . LAPAROSCOPIC TUBAL LIGATION Bilateral 10/11/2014   Procedure: LAPAROSCOPIC BILATERAL TUBAL LIGATION with Bipolar Cautery;  Surgeon: Marvene Staff, MD;  Location: [facility];  Service: Gynecology;  Laterality: Bilateral;    Family History  Problem Relation Age of Onset  . Heart disease Mother   . Heart attack Mother   . Hypertension Mother   . Heart attack Father   . Diabetes Father   . Hypertension Father   . Prostate cancer Father   . Ovarian cancer Maternal Grandmother   . Colon cancer Neg Hx   . Stomach cancer Neg Hx   . Esophageal cancer Neg Hx   . Liver disease Neg Hx   . Pancreatic cancer Neg Hx     Social History   Socioeconomic History  . Marital status: Single    Spouse name: Not on file  . Number of children: 2  . Years of education: Not on file  . Highest education level: Some college, no  degree  Occupational History  . Not on file  Tobacco Use  . Smoking status: Never Smoker  . Smokeless tobacco: Never Used  Vaping Use  . Vaping Use: Never used  Substance and Sexual Activity  . Alcohol use: No  . Drug use: No  . Sexual activity: Not Currently    Birth control/protection: None  Other Topics Concern  . Not on file  Social History Narrative  . Not on file   Social Determinants of Health   Financial Resource Strain: Not on file  Food Insecurity: Not on file  Transportation Needs: Not on file  Physical Activity: Not on file  Stress: Not on file  Social Connections: Not on file  Intimate Partner Violence: Not on file    Outpatient Medications Prior to Visit  Medication Sig Dispense Refill  . flintstones complete (FLINTSTONES) 60 MG chewable tablet Chew 2 tablets by mouth daily.    Marland Kitchen acetaminophen (TYLENOL) 500 MG tablet Take 1,000 mg by mouth every 6 (six) hours as needed for headache.    . cyanocobalamin 1000 MCG tablet Take 1 tablet by mouth as needed.    Marland Kitchen Fe-Succ-C-Thre-B12-Des Stomach (MULTIGEN PO) Take 1 tablet by mouth daily.    . Vitamin D, Ergocalciferol, (DRISDOL) 50000 UNITS CAPS capsule Take 50,000 Units by mouth every 7 (seven) days. Takes on Monday     No facility-administered medications prior to visit.  Allergies  Allergen Reactions  . Folic Acid Hives    ROS Review of Systems  Constitutional: Negative.  Negative for fever.  Respiratory: Negative.   Cardiovascular: Negative.   Gastrointestinal: Negative.  Negative for anorexia.  Genitourinary: Negative.   Skin: Positive for color change and rash.  All other systems reviewed and are negative.     Objective:    Physical Exam Vitals reviewed.  Constitutional:      Appearance: Normal appearance.  HENT:     Head: Normocephalic.     Nose: Nose normal.  Eyes:     Conjunctiva/sclera: Conjunctivae normal.  Cardiovascular:     Rate and Rhythm: Normal rate and regular rhythm.      Pulses: Normal pulses.     Heart sounds: Normal heart sounds.  Pulmonary:     Effort: Pulmonary effort is normal.     Breath sounds: Normal breath sounds.  Abdominal:     General: Bowel sounds are normal.  Musculoskeletal:        General: Normal range of motion.  Skin:    Findings: Rash present.     Comments: discoloration  Neurological:     Mental Status: She is alert and oriented to person, place, and time.     BP 119/65   Pulse (!) 55   Temp 97.9 F (36.6 C)   Ht 5' 7.5" (1.715 m)   Wt 219 lb 12.8 oz (99.7 kg)   SpO2 100%   BMI 33.92 kg/m  Wt Readings from Last 3 Encounters:  01/09/21 219 lb 12.8 oz (99.7 kg)  01/04/21 220 lb (99.8 kg)  09/14/20 215 lb 3.2 oz (97.6 kg)     Health Maintenance Due  Topic Date Due  . Hepatitis C Screening  Never done  . HIV Screening  Never done  . PAP SMEAR-Modifier  Never done  . COLONOSCOPY (Pts 45-60yrs Insurance coverage will need to be confirmed)  Never done  . MAMMOGRAM  Never done  . INFLUENZA VACCINE  Never done    There are no preventive care reminders to display for this patient.  No results found for: TSH Lab Results  Component Value Date   WBC 6.6 01/04/2021   HGB 13.0 01/04/2021   HCT 38.6 01/04/2021   MCV 96.0 01/04/2021   PLT 261 01/04/2021      Assessment & Plan:   Problem List Items Addressed This Visit      Musculoskeletal and Integument   Rash - Primary    Patient is reporting recurrent rash on the need skin fold that is unresolving since having with Mohs surgery. Patient has used con starch powder to keep skin dry with very mild effect. Started patient on nystatin powder, antifungal cream. Advised patient to keep skin dry and wear breathable cotton clothing.  Patient developed lesion with uneven borders with discoloration on bilateral thighs with left side greater than right. Referral to dermatology for further evaluation completed. Follow-up with worsening unresolved symptoms. Education provided  to patient with printed handouts given.  Rx sent to pharmacy.      Relevant Medications   nystatin (MYCOSTATIN/NYSTOP) powder   clotrimazole (ANTIFUNGAL, CLOTRIMAZOLE,) 1 % cream   Other Relevant Orders   Ambulatory referral to Dermatology       Follow-up: Return if symptoms worsen or fail to improve.    Ivy Lynn, NP

## 2021-01-09 NOTE — Patient Instructions (Signed)
Rash, Adult  A rash is a change in the color of your skin. A rash can also change the way your skin feels. There are many different conditions and factors that can cause a rash. Follow these instructions at home: The goal of treatment is to stop the itching and keep the rash from spreading. Watch for any changes in your symptoms. Let your doctor know about them. Follow these instructions to help with your condition: Medicine Take or apply over-the-counter and prescription medicines only as told by your doctor. These may include medicines:  To treat red or swollen skin (corticosteroid creams).  To treat itching.  To treat an allergy (oral antihistamines).  To treat very bad symptoms (oral corticosteroids).   Skin care  Put cool cloths (compresses) on the affected areas.  Do not scratch or rub your skin.  Avoid covering the rash. Make sure that the rash is exposed to air as much as possible. Managing itching and discomfort  Avoid hot showers or baths. These can make itching worse. A cold shower may help.  Try taking a bath with: ? Epsom salts. You can get these at your local pharmacy or grocery store. Follow the instructions on the package. ? Baking soda. Pour a small amount into the bath as told by your doctor. ? Colloidal oatmeal. You can get this at your local pharmacy or grocery store. Follow the instructions on the package.  Try putting baking soda paste onto your skin. Stir water into baking soda until it gets like a paste.  Try putting on a lotion that relieves itchiness (calamine lotion).  Keep cool and out of the sun. Sweating and being hot can make itching worse. General instructions  Rest as needed.  Drink enough fluid to keep your pee (urine) pale yellow.  Wear loose-fitting clothing.  Avoid scented soaps, detergents, and perfumes. Use gentle soaps, detergents, perfumes, and other cosmetic products.  Avoid anything that causes your rash. Keep a journal to help  track what causes your rash. Write down: ? What you eat. ? What cosmetic products you use. ? What you drink. ? What you wear. This includes jewelry.  Keep all follow-up visits as told by your doctor. This is important.   Contact a doctor if:  You sweat at night.  You lose weight.  You pee (urinate) more than normal.  You pee less than normal, or you notice that your pee is a darker color than normal.  You feel weak.  You throw up (vomit).  Your skin or the whites of your eyes look yellow (jaundice).  Your skin: ? Tingles. ? Is numb.  Your rash: ? Does not go away after a few days. ? Gets worse.  You are: ? More thirsty than normal. ? More tired than normal.  You have: ? New symptoms. ? Pain in your belly (abdomen). ? A fever. ? Watery poop (diarrhea). Get help right away if:  You have a fever and your symptoms suddenly get worse.  You start to feel mixed up (confused).  You have a very bad headache or a stiff neck.  You have very bad joint pains or stiffness.  You have jerky movements that you cannot control (seizure).  Your rash covers all or most of your body. The rash may or may not be painful.  You have blisters that: ? Are on top of the rash. ? Grow larger. ? Grow together. ? Are painful. ? Are inside your nose or mouth.  You have   a rash that: ? Looks like purple pinprick-sized spots all over your body. ? Has a "bull's eye" or looks like a target. ? Is red and painful, causes your skin to peel, and is not from being in the sun too long. Summary  A rash is a change in the color of your skin. A rash can also change the way your skin feels.  The goal of treatment is to stop the itching and keep the rash from spreading.  Take or apply over-the-counter and prescription medicines only as told by your doctor.  Contact a doctor if you have new symptoms or symptoms that get worse.  Keep all follow-up visits as told by your doctor. This is  important. This information is not intended to replace advice given to you by your health care provider. Make sure you discuss any questions you have with your health care provider. Document Revised: 03/05/2019 Document Reviewed: 06/15/2018 Elsevier Patient Education  2021 Elsevier Inc.  

## 2021-01-09 NOTE — Assessment & Plan Note (Signed)
Patient is reporting recurrent rash on the need skin fold that is unresolving since having with Mohs surgery. Patient has used con starch powder to keep skin dry with very mild effect. Started patient on nystatin powder, antifungal cream. Advised patient to keep skin dry and wear breathable cotton clothing.  Patient developed lesion with uneven borders with discoloration on bilateral thighs with left side greater than right. Referral to dermatology for further evaluation completed. Follow-up with worsening unresolved symptoms. Education provided to patient with printed handouts given.  Rx sent to pharmacy.

## 2021-02-15 ENCOUNTER — Telehealth: Payer: Self-pay | Admitting: Dermatology

## 2021-02-15 NOTE — Telephone Encounter (Signed)
Referral, didn't want to wait till end of August, said SHE would call her doctor and see if he can get her in somewhere else sooner

## 2021-02-15 NOTE — Telephone Encounter (Signed)
Referral routed back to referring office

## 2021-04-04 ENCOUNTER — Ambulatory Visit (INDEPENDENT_AMBULATORY_CARE_PROVIDER_SITE_OTHER): Payer: PRIVATE HEALTH INSURANCE | Admitting: Nurse Practitioner

## 2021-04-04 ENCOUNTER — Encounter: Payer: Self-pay | Admitting: Nurse Practitioner

## 2021-04-04 ENCOUNTER — Other Ambulatory Visit: Payer: Self-pay

## 2021-04-04 VITALS — BP 119/74 | HR 85 | Temp 97.4°F

## 2021-04-04 DIAGNOSIS — Z0000 Encounter for general adult medical examination without abnormal findings: Secondary | ICD-10-CM

## 2021-04-04 NOTE — Patient Instructions (Signed)
Health Maintenance, Female Adopting a healthy lifestyle and getting preventive care are important in promoting health and wellness. Ask your health care provider about:  The right schedule for you to have regular tests and exams.  Things you can do on your own to prevent diseases and keep yourself healthy. What should I know about diet, weight, and exercise? Eat a healthy diet  Eat a diet that includes plenty of vegetables, fruits, low-fat dairy products, and lean protein.  Do not eat a lot of foods that are high in solid fats, added sugars, or sodium.   Maintain a healthy weight Body mass index (BMI) is used to identify weight problems. It estimates body fat based on height and weight. Your health care provider can help determine your BMI and help you achieve or maintain a healthy weight. Get regular exercise Get regular exercise. This is one of the most important things you can do for your health. Most adults should:  Exercise for at least 150 minutes each week. The exercise should increase your heart rate and make you sweat (moderate-intensity exercise).  Do strengthening exercises at least twice a week. This is in addition to the moderate-intensity exercise.  Spend less time sitting. Even light physical activity can be beneficial. Watch cholesterol and blood lipids Have your blood tested for lipids and cholesterol at 51 years of age, then have this test every 5 years. Have your cholesterol levels checked more often if:  Your lipid or cholesterol levels are high.  You are older than 51 years of age.  You are at high risk for heart disease. What should I know about cancer screening? Depending on your health history and family history, you may need to have cancer screening at various ages. This may include screening for:  Breast cancer.  Cervical cancer.  Colorectal cancer.  Skin cancer.  Lung cancer. What should I know about heart disease, diabetes, and high blood  pressure? Blood pressure and heart disease  High blood pressure causes heart disease and increases the risk of stroke. This is more likely to develop in people who have high blood pressure readings, are of African descent, or are overweight.  Have your blood pressure checked: ? Every 3-5 years if you are 18-39 years of age. ? Every year if you are 40 years old or older. Diabetes Have regular diabetes screenings. This checks your fasting blood sugar level. Have the screening done:  Once every three years after age 40 if you are at a normal weight and have a low risk for diabetes.  More often and at a younger age if you are overweight or have a high risk for diabetes. What should I know about preventing infection? Hepatitis B If you have a higher risk for hepatitis B, you should be screened for this virus. Talk with your health care provider to find out if you are at risk for hepatitis B infection. Hepatitis C Testing is recommended for:  Everyone born from 1945 through 1965.  Anyone with known risk factors for hepatitis C. Sexually transmitted infections (STIs)  Get screened for STIs, including gonorrhea and chlamydia, if: ? You are sexually active and are younger than 51 years of age. ? You are older than 51 years of age and your health care provider tells you that you are at risk for this type of infection. ? Your sexual activity has changed since you were last screened, and you are at increased risk for chlamydia or gonorrhea. Ask your health care provider   if you are at risk.  Ask your health care provider about whether you are at high risk for HIV. Your health care provider may recommend a prescription medicine to help prevent HIV infection. If you choose to take medicine to prevent HIV, you should first get tested for HIV. You should then be tested every 3 months for as long as you are taking the medicine. Pregnancy  If you are about to stop having your period (premenopausal) and  you may become pregnant, seek counseling before you get pregnant.  Take 400 to 800 micrograms (mcg) of folic acid every day if you become pregnant.  Ask for birth control (contraception) if you want to prevent pregnancy. Osteoporosis and menopause Osteoporosis is a disease in which the bones lose minerals and strength with aging. This can result in bone fractures. If you are 65 years old or older, or if you are at risk for osteoporosis and fractures, ask your health care provider if you should:  Be screened for bone loss.  Take a calcium or vitamin D supplement to lower your risk of fractures.  Be given hormone replacement therapy (HRT) to treat symptoms of menopause. Follow these instructions at home: Lifestyle  Do not use any products that contain nicotine or tobacco, such as cigarettes, e-cigarettes, and chewing tobacco. If you need help quitting, ask your health care provider.  Do not use street drugs.  Do not share needles.  Ask your health care provider for help if you need support or information about quitting drugs. Alcohol use  Do not drink alcohol if: ? Your health care provider tells you not to drink. ? You are pregnant, may be pregnant, or are planning to become pregnant.  If you drink alcohol: ? Limit how much you use to 0-1 drink a day. ? Limit intake if you are breastfeeding.  Be aware of how much alcohol is in your drink. In the [place] one drink equals one 12 oz bottle of beer (355 mL), one 5 oz glass of wine (148 mL), or one 1 oz glass of hard liquor (44 mL). General instructions  Schedule regular health, dental, and eye exams.  Stay current with your vaccines.  Tell your health care provider if: ? You often feel depressed. ? You have ever been abused or do not feel safe at home. Summary  Adopting a healthy lifestyle and getting preventive care are important in promoting health and wellness.  Follow your health care provider's instructions about healthy  diet, exercising, and getting tested or screened for diseases.  Follow your health care provider's instructions on monitoring your cholesterol and blood pressure. This information is not intended to replace advice given to you by your health care provider. Make sure you discuss any questions you have with your health care provider. Document Revised: 11/04/2018 Document Reviewed: 11/04/2018 Elsevier Patient Education  2021 Elsevier Inc.  

## 2021-04-04 NOTE — Progress Notes (Signed)
Established Patient Office Visit  Subjective:  Patient ID: Dawn Acevedo, female    DOB: 1971/03/09  Age: 50 y.o. MRN: [id number]  CC:  Chief Complaint  Patient presents with  . Annual Exam    HPI [name] presents for .   Encounter for general adult medical examination without abnormal findings  Physical : Patient's last physical exam was 1 year ago .  Weight: Appropriate for height (BMI greater than 27%) ;  Blood Pressure: Normal (BP less than 120/80) ;  Medical History: Patient history reviewed ; Family history reviewed ; yes Allergies Reviewed: No change in current allergies ; yes Medications Reviewed: Medications reviewed - no changes ;  Lipids: Normal lipid levels ; labs completed results pending. Smoking: Life-long non-smoker  Physical Activity: Exercises at least 3 times per week ; as tolerated Alcohol/Drug Use: Is a non-drinker , No illicit drug use.  Patient is not afflicted from Stress Incontinence and Urge Incontinence  Safety: reviewed ; Patient wears a seat belt, has smoke detectors, has carbon monoxide detectors, practices appropriate gun safety, and wears sunscreen with extended sun exposure. Dental Care: biannual cleanings, brushes and flosses daily. Ophthalmology/Optometry: Annual visit.  Hearing loss: none Vision impairments: none  Past Medical History:  Diagnosis Date  . Anemia   . Arthritis     Past Surgical History:  Procedure Laterality Date  . DILITATION & CURRETTAGE/HYSTROSCOPY WITH NOVASURE ABLATION N/A 10/11/2014   Procedure: DILATATION & CURETTAGE/HYSTEROSCOPY WITH NOVASURE ABLATION;  Surgeon: Marvene Staff, MD;  Location: [facility];  Service: Gynecology;  Laterality: N/A;  . LAPAROSCOPIC GASTRIC SLEEVE RESECTION     gastric sleeve  . LAPAROSCOPIC TUBAL LIGATION Bilateral 10/11/2014   Procedure: LAPAROSCOPIC BILATERAL TUBAL LIGATION with Bipolar Cautery;  Surgeon: Marvene Staff, MD;  Location: [facility];  Service:  Gynecology;  Laterality: Bilateral;    Family History  Problem Relation Age of Onset  . Heart disease Mother   . Heart attack Mother   . Hypertension Mother   . Heart attack Father   . Diabetes Father   . Hypertension Father   . Prostate cancer Father   . Ovarian cancer Maternal Grandmother   . Colon cancer Neg Hx   . Stomach cancer Neg Hx   . Esophageal cancer Neg Hx   . Liver disease Neg Hx   . Pancreatic cancer Neg Hx     Social History   Socioeconomic History  . Marital status: Single    Spouse name: Not on file  . Number of children: 2  . Years of education: Not on file  . Highest education level: Some college, no degree  Occupational History  . Not on file  Tobacco Use  . Smoking status: Never Smoker  . Smokeless tobacco: Never Used  Vaping Use  . Vaping Use: Never used  Substance and Sexual Activity  . Alcohol use: No  . Drug use: No  . Sexual activity: Not Currently    Birth control/protection: None  Other Topics Concern  . Not on file  Social History Narrative  . Not on file   Social Determinants of Health   Financial Resource Strain: Not on file  Food Insecurity: Not on file  Transportation Needs: Not on file  Physical Activity: Not on file  Stress: Not on file  Social Connections: Not on file  Intimate Partner Violence: Not on file    Outpatient Medications Prior to Visit  Medication Sig Dispense Refill  . clotrimazole (ANTIFUNGAL, CLOTRIMAZOLE,) 1 %  cream Apply 1 application topically 2 (two) times daily. 30 g 0  . flintstones complete (FLINTSTONES) 60 MG chewable tablet Chew 2 tablets by mouth daily.    Marland Kitchen nystatin (MYCOSTATIN/NYSTOP) powder Apply 1 application topically 3 (three) times daily. 15 g 0   No facility-administered medications prior to visit.    Allergies  Allergen Reactions  . Folic Acid Hives    ROS Review of Systems  Constitutional: Negative.   HENT: Negative.   Respiratory: Negative.   Cardiovascular: Negative.    Gastrointestinal: Negative.   Genitourinary: Negative.   Musculoskeletal: Negative.   Skin: Negative for rash.  Neurological: Negative.   Psychiatric/Behavioral: Negative.       Objective:    Physical Exam Vitals reviewed.  Constitutional:      Appearance: Normal appearance.  HENT:     Head: Normocephalic.     Right Ear: Tympanic membrane and external ear normal. There is no impacted cerumen.     Left Ear: Tympanic membrane and external ear normal. There is no impacted cerumen.     Nose: Nose normal.     Mouth/Throat:     Mouth: Mucous membranes are moist.     Pharynx: Oropharynx is clear. No oropharyngeal exudate.  Eyes:     General: No scleral icterus.       Right eye: No discharge.        Left eye: No discharge.     Conjunctiva/sclera: Conjunctivae normal.     Pupils: Pupils are equal, round, and reactive to light.  Cardiovascular:     Rate and Rhythm: Normal rate and regular rhythm.     Pulses: Normal pulses.     Heart sounds: Normal heart sounds.  Pulmonary:     Effort: Pulmonary effort is normal.     Breath sounds: Normal breath sounds.  Abdominal:     General: Bowel sounds are normal.  Musculoskeletal:        General: Normal range of motion.  Skin:    General: Skin is warm.  Neurological:     Mental Status: She is alert and oriented to person, place, and time.  Psychiatric:        Mood and Affect: Mood normal.        Behavior: Behavior normal.     There were no vitals taken for this visit. Wt Readings from Last 3 Encounters:  01/09/21 219 lb 12.8 oz (99.7 kg)  01/04/21 220 lb (99.8 kg)  09/14/20 215 lb 3.2 oz (97.6 kg)     Health Maintenance Due  Topic Date Due  . HIV Screening  Never done  . Hepatitis C Screening  Never done  . PAP SMEAR-Modifier  Never done  . COLONOSCOPY (Pts 45-21yrs Insurance coverage will need to be confirmed)  Never done  . MAMMOGRAM  Never done    There are no preventive care reminders to display for this  patient.  No results found for: TSH Lab Results  Component Value Date   WBC 6.6 01/04/2021   HGB 13.0 01/04/2021   HCT 38.6 01/04/2021   MCV 96.0 01/04/2021   PLT 261 01/04/2021      Assessment & Plan:   Problem List Items Addressed This Visit      Other   Annual physical exam - Primary    Completed head to toe assessment.  Patient has no new concerns education provided for preventative and health maintenance.  Patient completed Pap in 2021 Next due 2024.  Scheduled for colonoscopy in July and  mammogram scheduled in the next few weeks.      Relevant Orders   Lipid Panel   HIV Antibody (routine testing w rflx)   Hepatitis C antibody      No orders of the defined types were placed in this encounter.   Follow-up: Return in about 1 year (around 04/04/2022).    Ivy Lynn, NP

## 2021-04-05 LAB — LIPID PANEL
Chol/HDL Ratio: 2.2 ratio (ref 0.0–4.4)
Cholesterol, Total: 148 mg/dL (ref 100–199)
HDL: 67 mg/dL (ref >39)
LDL Chol Calc (NIH): 72 mg/dL (ref 0–99)
Triglycerides: 36 mg/dL (ref 0–149)
VLDL Cholesterol Cal: 9 mg/dL (ref 5–40)

## 2021-04-05 LAB — HIV ANTIBODY (ROUTINE TESTING W REFLEX): HIV Screen 4th Generation wRfx: NONREACTIVE

## 2021-04-05 LAB — HEPATITIS C ANTIBODY: Hep C Virus Ab: <0.1 s/co ratio (ref 0.0–0.9)

## 2021-04-05 NOTE — Assessment & Plan Note (Signed)
Completed head to toe assessment.  Patient has no new concerns education provided for preventative and health maintenance.  Patient completed Pap in 2021 Next due 2024.  Scheduled for colonoscopy in July and mammogram scheduled in the next few weeks.

## 2021-05-07 ENCOUNTER — Other Ambulatory Visit: Payer: Self-pay

## 2021-05-07 ENCOUNTER — Ambulatory Visit (AMBULATORY_SURGERY_CENTER): Payer: PRIVATE HEALTH INSURANCE

## 2021-05-07 VITALS — Ht 68.0 in | Wt 212.0 lb

## 2021-05-07 DIAGNOSIS — Z1211 Encounter for screening for malignant neoplasm of colon: Secondary | ICD-10-CM

## 2021-05-07 DIAGNOSIS — D649 Anemia, unspecified: Secondary | ICD-10-CM

## 2021-05-07 MED ORDER — SUTAB 1479-225-188 MG PO TABS: 1.0000 | ORAL_TABLET | ORAL | 0 refills | Status: DC

## 2021-05-07 NOTE — Progress Notes (Signed)
Pre visit completed via phone call; patient verified name, DOB, and address; No egg or soy allergy known to patient  No issues with past sedation with any surgeries or procedures Patient denies ever being told they had issues or difficulty with intubation  No FH of Malignant Hyperthermia No diet pills per patient No home 02 use per patient  No blood thinners per patient  Pt denies issues with constipation  No A fib or A flutter  EMMI video via MyChart  COVID 19 guidelines implemented in PV today with Pt and RN  Pt is fully vaccinated for Covid x 2 + booster;  NO PA's for preps discussed with pt in PV today  Discussed with pt there will be an out-of-pocket cost for prep and that varies from $0 to 70 dollars   Due to the COVID-19 pandemic we are asking patients to follow certain guidelines.  Pt aware of COVID protocols and [place] guidelines   Sutab coupon code sent to pharmacy and coupon sent to patient

## 2021-05-21 ENCOUNTER — Encounter: Payer: PRIVATE HEALTH INSURANCE | Admitting: Gastroenterology

## 2021-06-02 ENCOUNTER — Encounter (HOSPITAL_COMMUNITY): Payer: Self-pay | Admitting: *Deleted

## 2021-06-02 ENCOUNTER — Other Ambulatory Visit: Payer: Self-pay

## 2021-06-02 ENCOUNTER — Emergency Department (HOSPITAL_COMMUNITY)
Admission: EM | Admit: 2021-06-02 | Discharge: 2021-06-02 | Disposition: A | Discharge status: Home / Self Care | Payer: PRIVATE HEALTH INSURANCE | Attending: Emergency Medicine | Admitting: Emergency Medicine

## 2021-06-02 DIAGNOSIS — R1030 Lower abdominal pain, unspecified: Secondary | ICD-10-CM | POA: Diagnosis present

## 2021-06-02 DIAGNOSIS — N390 Urinary tract infection, site not specified: Secondary | ICD-10-CM | POA: Insufficient documentation

## 2021-06-02 LAB — URINALYSIS, ROUTINE W REFLEX MICROSCOPIC
Bacteria, UA: NONE SEEN
Bilirubin Urine: NEGATIVE
Glucose, UA: NEGATIVE mg/dL
Ketones, ur: NEGATIVE mg/dL
Nitrite: NEGATIVE
Protein, ur: 100 mg/dL — AB
RBC / HPF: >50 RBC/hpf — ABNORMAL HIGH (ref 0–5)
Specific Gravity, Urine: 1.015 (ref 1.005–1.030)
pH: 7 (ref 5.0–8.0)

## 2021-06-02 MED ORDER — CEPHALEXIN 500 MG PO CAPS
500.0000 mg | ORAL_CAPSULE | Freq: Four times a day (QID) | ORAL | 0 refills | Status: DC
Start: 2021-06-02 — End: 2021-06-08

## 2021-06-02 MED ORDER — CEPHALEXIN 500 MG PO CAPS
1000.0000 mg | ORAL_CAPSULE | Freq: Once | ORAL | Status: AC
  Administered 2021-06-02: 1000 mg via ORAL
  Filled 2021-06-02: qty 2

## 2021-06-02 MED ORDER — PHENAZOPYRIDINE HCL 200 MG PO TABS: 200.0000 mg | ORAL_TABLET | Freq: Three times a day (TID) | ORAL | 0 refills | Status: DC | PRN

## 2021-06-02 MED ORDER — PHENAZOPYRIDINE HCL 100 MG PO TABS
100.0000 mg | ORAL_TABLET | Freq: Once | ORAL | Status: AC
  Administered 2021-06-02: 100 mg via ORAL
  Filled 2021-06-02: qty 1

## 2021-06-02 NOTE — ED Triage Notes (Signed)
Pt has been treating self with OTC meds for self-diagnosed UTI. Was awoken by 7/10 pain in lower abdomen around 2am.

## 2021-06-02 NOTE — ED Provider Notes (Signed)
[facility] EMERGENCY DEPARTMENT Provider Note   CSN: [id number] Arrival date & time: 06/02/21  0327     History Chief Complaint  Patient presents with   Abdominal Pain    Dawn Acevedo is a 50 y.o. female.   Abdominal Pain Pain location:  Suprapubic Pain quality: aching, cramping and sharp   Pain radiates to:  Does not radiate Pain severity:  Moderate Onset quality:  Gradual Duration:  2 days Timing:  Constant Progression:  Worsening Chronicity:  New Context: not alcohol use, not awakening from sleep, not previous surgeries and not retching   Relieved by:  Nothing Worsened by:  Nothing Ineffective treatments:  OTC medications Associated symptoms: dysuria and nausea   Associated symptoms: no anorexia, no hematemesis, no melena and no shortness of breath       Past Medical History:  Diagnosis Date   Anemia    Arthritis    RIGHT knee   History of vitamin D deficiency     Patient Active Problem List   Diagnosis Date Noted   Rash 01/09/2021   Iron deficiency anemia 01/04/2021   Anemia 09/15/2020   Annual physical exam 09/15/2020    Past Surgical History:  Procedure Laterality Date   DILITATION & CURRETTAGE/HYSTROSCOPY WITH NOVASURE ABLATION N/A 10/11/2014   Procedure: DILATATION & CURETTAGE/HYSTEROSCOPY WITH NOVASURE ABLATION;  Surgeon: Dawn Staff, MD;  Location: [facility];  Service: Gynecology;  Laterality: N/A;   LAPAROSCOPIC GASTRIC SLEEVE RESECTION     gastric sleeve   LAPAROSCOPIC TUBAL LIGATION Bilateral 10/11/2014   Procedure: LAPAROSCOPIC BILATERAL TUBAL LIGATION with Bipolar Cautery;  Surgeon: Dawn Staff, MD;  Location: [facility];  Service: Gynecology;  Laterality: Bilateral;   WISDOM TOOTH EXTRACTION       OB History     Gravida  2   Para  2   Term  2   Preterm      AB      Living  2      SAB      IAB      Ectopic      Multiple      Live Births              Family History  Problem Relation Age of  Onset   Heart disease Mother    Heart attack Mother    Hypertension Mother    Heart attack Father    Diabetes Father    Hypertension Father    Prostate cancer Father    Ovarian cancer Maternal Grandmother    Colon cancer Neg Hx    Stomach cancer Neg Hx    Esophageal cancer Neg Hx    Liver disease Neg Hx    Pancreatic cancer Neg Hx    Colon polyps Neg Hx    Rectal cancer Neg Hx     Social History   Tobacco Use   Smoking status: Never   Smokeless tobacco: Never  Vaping Use   Vaping Use: Never used  Substance Use Topics   Alcohol use: No    Comment: 1-2 glasses of wine per month   Drug use: No    Home Medications Prior to Admission medications   Medication Sig Start Date End Date Taking? Authorizing Provider  cephALEXin (KEFLEX) 500 MG capsule Take 1 capsule (500 mg total) by mouth 4 (four) times daily. 06/02/21  Yes Dawn Acevedo, Dawn Cornea, MD  phenazopyridine (PYRIDIUM) 200 MG tablet Take 1 tablet (200 mg total) by mouth 3 (three) times daily as  needed for pain. 06/02/21  Yes Dawn Acevedo, Dawn Cornea, MD  Sodium Sulfate-Mag Sulfate-KCl (SUTAB) [id number] MG TABS Take 1 kit by mouth as directed. MANUFACTURER CODES!! BIN: [id number] PCN: CN GROUP: [id number] MEMBER ID: [id number];[employer] AS SECONDARY INSURANCE ;NO PRIOR AUTHORIZATION 05/07/21   Dawn Stabler, MD    Allergies    Folic acid  Review of Systems   Review of Systems  Respiratory:  Negative for shortness of breath.   Gastrointestinal:  Positive for abdominal pain and nausea. Negative for anorexia, hematemesis and melena.  Genitourinary:  Positive for dysuria.  All other systems reviewed and are negative.  Physical Exam Updated Vital Signs BP 130/90   Pulse 68   Temp 98.2 F (36.8 C) (Oral)   Resp 18   Ht _0  (1.727 m)   Wt 94.3 kg   SpO2 100%   BMI 31.63 kg/m   Physical Exam Vitals and nursing note reviewed.  Constitutional:      Appearance: She is well-developed.  HENT:     Head: Normocephalic and atraumatic.      Mouth/Throat:     Mouth: Mucous membranes are moist.     Pharynx: Oropharynx is clear.  Eyes:     Pupils: Pupils are equal, round, and reactive to light.  Cardiovascular:     Rate and Rhythm: Normal rate and regular rhythm.  Pulmonary:     Effort: No respiratory distress.     Breath sounds: No stridor.  Abdominal:     General: Abdomen is flat. There is no distension.     Tenderness: There is abdominal tenderness (suprapubic).  Musculoskeletal:     Cervical back: Normal range of motion.  Skin:    General: Skin is dry.  Neurological:     General: No focal deficit present.     Mental Status: She is alert.    ED Results / Procedures / Treatments   Labs (all labs ordered are listed, but only abnormal results are displayed) Labs Reviewed  URINALYSIS, ROUTINE W REFLEX MICROSCOPIC - Abnormal; Notable for the following components:      Result Value   Color, Urine AMBER (*)    APPearance CLOUDY (*)    Hgb urine dipstick LARGE (*)    Protein, ur 100 (*)    Leukocytes,Ua LARGE (*)    RBC / HPF >50 (*)    Non Squamous Epithelial 0-5 (*)    All other components within normal limits  URINE CULTURE    EKG None  Radiology No results found.  Procedures Procedures   Medications Ordered in ED Medications  cephALEXin (KEFLEX) capsule 1,000 mg (1,000 mg Oral Given 06/02/21 0457)  phenazopyridine (PYRIDIUM) tablet 100 mg (100 mg Oral Given 06/02/21 0457)    ED Course  I have reviewed the triage vital signs and the nursing notes.  Pertinent labs & imaging results that were available during my care of the patient were reviewed by me and considered in my medical decision making (see chart for details).    MDM Rules/Calculators/A&P                          Likely UTI, culture added. No sepsis or e/o pyelonephritis. Dc on meds as below.   Final Clinical Impression(s) / ED Diagnoses Final diagnoses:  Lower urinary tract infectious disease    Rx / DC Orders ED Discharge Orders           Ordered    cephALEXin (KEFLEX) 500 MG  capsule  4 times daily        06/02/21 0450    phenazopyridine (PYRIDIUM) 200 MG tablet  3 times daily PRN        06/02/21 0450             Dawn Acevedo, Dawn Cornea, MD 06/02/21 [phone]

## 2021-06-04 ENCOUNTER — Ambulatory Visit (AMBULATORY_SURGERY_CENTER): Payer: PRIVATE HEALTH INSURANCE | Admitting: Gastroenterology

## 2021-06-04 ENCOUNTER — Other Ambulatory Visit: Payer: Self-pay

## 2021-06-04 ENCOUNTER — Encounter: Payer: Self-pay | Admitting: Gastroenterology

## 2021-06-04 VITALS — BP 130/73 | HR 57 | Temp 98.1°F | Resp 21 | Ht 68.0 in | Wt 212.0 lb

## 2021-06-04 DIAGNOSIS — Z1211 Encounter for screening for malignant neoplasm of colon: Secondary | ICD-10-CM | POA: Diagnosis present

## 2021-06-04 DIAGNOSIS — D128 Benign neoplasm of rectum: Secondary | ICD-10-CM

## 2021-06-04 DIAGNOSIS — D129 Benign neoplasm of anus and anal canal: Secondary | ICD-10-CM

## 2021-06-04 LAB — URINE CULTURE: Culture: >=100000 — AB

## 2021-06-04 MED ORDER — SODIUM CHLORIDE 0.9 % IV SOLN: 500.0000 mL | Freq: Once | INTRAVENOUS | Status: DC

## 2021-06-04 NOTE — Progress Notes (Signed)
Called to room to assist during endoscopic procedure.  Patient ID and intended procedure confirmed with present staff. Received instructions for my participation in the procedure from the performing physician.  

## 2021-06-04 NOTE — Progress Notes (Signed)
To PACU, VSS. Report to [email or web address] 

## 2021-06-04 NOTE — Op Note (Signed)
[place] Patient Name: Dawn Acevedo Procedure Date: 06/04/2021 2:48 PM MRN: [id number] Endoscopist: Mallie Mussel L. 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 Periodic IV                            iron given. Last Hgb and iron levels normal Feb 2022                           referred back this year for colon cancer screening) Medicines:                Monitored Anesthesia Care Procedure:                Pre-Anesthesia Assessment:                           - Prior to the procedure, a History and Physical                            was performed, and patient medications and                            allergies were reviewed. The patient's tolerance of                            previous anesthesia was also reviewed. The risks                            and benefits of the procedure and the sedation                            options and risks were discussed with the patient.                            All questions were answered, and informed consent  was obtained. Prior Anticoagulants: The patient has                            taken no previous anticoagulant or antiplatelet                            agents. ASA Grade Assessment: II - A patient with                            mild systemic disease. After reviewing the risks                            and benefits, the patient was deemed in                            satisfactory condition to  undergo the procedure.                           After obtaining informed consent, the colonoscope                            was passed under direct vision. Throughout the                            procedure, the patient's blood pressure, pulse, and                            oxygen saturations were monitored continuously. The                            CF [id number] #[id number] was introduced through the anus                            and advanced to the the cecum, identified by                            appendiceal orifice and ileocecal valve. The                            colonoscopy was performed without difficulty. The                            patient tolerated the procedure well. The quality                            of the bowel preparation was good. The ileocecal                            valve, appendiceal orifice, and rectum were                            photographed. Scope In: 2:59:19 PM Scope Out: 3:18:41 PM Scope Withdrawal Time: 0 hours 14 minutes 59 seconds  Total Procedure Duration: 0 hours  19 minutes 22 seconds  Findings:                 The perianal and digital rectal examinations were                            normal.                           A 3 mm polyp was found in the rectum. The polyp was                            sessile. The polyp was removed with a cold snare.                            Resection and retrieval were complete.                           The exam was otherwise without abnormality on                            direct and retroflexion views. Complications:            No immediate complications. Estimated Blood Loss:     Estimated blood loss was minimal. Impression:               - One 3 mm polyp in the rectum, removed with a cold                            snare. Resected and retrieved.                           - The examination was otherwise normal on direct                            and retroflexion views. Recommendation:           -  Patient has a contact number available for                            emergencies. The signs and symptoms of potential                            delayed complications were discussed with the                            patient. Return to normal activities tomorrow.                            Written discharge instructions were provided to the                            patient.                           - Resume previous diet.                           -  Continue present medications.                           - Await pathology results.                           - Repeat colonoscopy is recommended for                            surveillance. The colonoscopy date will be                            determined after pathology results from today's                            exam become available for review.                           - If patient develops chronic upper digestive                            symptoms, evidence of GI bleeding or IDA refractory                            to IV iron, refer back to this office. Molli Gethers L. Loletha Carrow, MD 06/04/2021 3:25:11 PM This report has been signed electronically.

## 2021-06-04 NOTE — Progress Notes (Signed)
C.W. vital signs. 

## 2021-06-04 NOTE — Patient Instructions (Signed)
Information on polyps given to you today.  Await pathology results.  Resume previous diet and medications.  YOU HAD AN ENDOSCOPIC PROCEDURE TODAY AT THE [facility]:   Refer to the procedure report that was given to you for any specific questions about what was found during the examination.  If the procedure report does not answer your questions, please call your gastroenterologist to clarify.  If you requested that your care partner not be given the details of your procedure findings, then the procedure report has been included in a sealed envelope for you to review at your convenience later.  YOU SHOULD EXPECT: Some feelings of bloating in the abdomen. Passage of more gas than usual.  Walking can help get rid of the air that was put into your GI tract during the procedure and reduce the bloating. If you had a lower endoscopy (such as a colonoscopy or flexible sigmoidoscopy) you may notice spotting of blood in your stool or on the toilet paper. If you underwent a bowel prep for your procedure, you may not have a normal bowel movement for a few days.  Please Note:  You might notice some irritation and congestion in your nose or some drainage.  This is from the oxygen used during your procedure.  There is no need for concern and it should clear up in a day or so.  SYMPTOMS TO REPORT IMMEDIATELY:   Following lower endoscopy (colonoscopy or flexible sigmoidoscopy):  Excessive amounts of blood in the stool  Significant tenderness or worsening of abdominal pains  Swelling of the abdomen that is new, acute  Fever of 100F or higher   For urgent or emergent issues, a gastroenterologist can be reached at any hour by calling [phone]. Do not use MyChart messaging for urgent concerns.    DIET:  We do recommend a small meal at first, but then you may proceed to your regular diet.  Drink plenty of fluids but you should avoid alcoholic beverages for 24 hours.  ACTIVITY:  You should  plan to take it easy for the rest of today and you should NOT DRIVE or use heavy machinery until tomorrow (because of the sedation medicines used during the test).    FOLLOW UP: Our staff will call the number listed on your records 48-72 hours following your procedure to check on you and address any questions or concerns that you may have regarding the information given to you following your procedure. If we do not reach you, we will leave a message.  We will attempt to reach you two times.  During this call, we will ask if you have developed any symptoms of COVID 19. If you develop any symptoms (ie: fever, flu-like symptoms, shortness of breath, cough etc.) before then, please call [phone].  If you test positive for Covid 19 in the 2 weeks post procedure, please call and report this information to us.    If any biopsies were taken you will be contacted by phone or by letter within the next 1-3 weeks.  Please call us at [phone] if you have not heard about the biopsies in 3 weeks.    SIGNATURES/CONFIDENTIALITY: You and/or your care partner have signed paperwork which will be entered into your electronic medical record.  These signatures attest to the fact that that the information above on your After Visit Summary has been reviewed and is understood.  Full responsibility of the confidentiality of this discharge information lies with you and/or your care-partner. 

## 2021-06-04 NOTE — Progress Notes (Signed)
Pt's states no medical or surgical changes since previsit or office visit. 

## 2021-06-05 ENCOUNTER — Telehealth: Payer: Self-pay | Admitting: *Deleted

## 2021-06-05 NOTE — Telephone Encounter (Signed)
Post ED Visit - Positive Culture Follow-up  Culture report reviewed by antimicrobial stewardship pharmacist: [facility] Team []  Elenor Quinones, Pharm.D. []  Heide Guile, Pharm.D., BCPS AQ-ID []  Parks Neptune, Pharm.D., BCPS []  Alycia Rossetti, Pharm.D., BCPS []  [place], [place].D., BCPS, AAHIVP []  Legrand Como, Pharm.D., BCPS, AAHIVP []  Salome Arnt, PharmD, BCPS []  Johnnette Gourd, PharmD, BCPS []  Hughes Better, PharmD, BCPS []  Leeroy Cha, PharmD []  Laqueta Linden, PharmD, BCPS []  Albertina Parr, PharmD  [facility] Team []  Leodis Sias, PharmD []  Lindell Spar, PharmD []  Royetta Asal, PharmD []  Graylin Shiver, Rph []  Rema Fendt) Glennon Mac, PharmD []  Arlyn Dunning, PharmD []  Netta Cedars, PharmD []  Dia Sitter, PharmD []  Leone Haven, PharmD []  Gretta Arab, PharmD []  Theodis Shove, PharmD []  Peggyann Juba, PharmD []  Reuel Boom, PharmD   Positive urine culture Treated with Cephalexin, organism sensitive to the same and no further patient follow-up is required at this time. Joetta Manners, PharmD  Harlon Flor Talley 06/05/2021, 11:50 AM

## 2021-06-06 ENCOUNTER — Telehealth: Payer: Self-pay | Admitting: *Deleted

## 2021-06-06 NOTE — Telephone Encounter (Signed)
  Follow up Call-  Call back number 06/04/2021  Post procedure Call Back phone  # [phone]  Permission to leave phone message Yes  Some recent data might be hidden     Patient questions:  Do you have a fever, pain , or abdominal swelling? No. Pain Score  0 *  Have you tolerated food without any problems? Yes.    Have you been able to return to your normal activities? Yes.    Do you have any questions about your discharge instructions: Diet   No. Medications  No. Follow up visit  No.  Do you have questions or concerns about your Care? No.  Actions: * If pain score is 4 or above: No action needed, pain <4.   Have you developed a fever since your procedure? no  2.   Have you had an respiratory symptoms (SOB or cough) since your procedure? no  3.   Have you tested positive for COVID 19 since your procedure no  4.   Have you had any family members/close contacts diagnosed with the COVID 19 since your procedure?  no   If yes to any of these questions please route to Joylene John, RN and Joella Prince, RN

## 2021-06-08 ENCOUNTER — Encounter: Payer: Self-pay | Admitting: Nurse Practitioner

## 2021-06-08 ENCOUNTER — Ambulatory Visit (INDEPENDENT_AMBULATORY_CARE_PROVIDER_SITE_OTHER): Payer: PRIVATE HEALTH INSURANCE | Admitting: Nurse Practitioner

## 2021-06-08 ENCOUNTER — Other Ambulatory Visit: Payer: Self-pay

## 2021-06-08 VITALS — BP 130/78 | HR 59 | Temp 97.9°F | Ht 68.0 in | Wt 205.0 lb

## 2021-06-08 DIAGNOSIS — Z87440 Personal history of urinary (tract) infections: Secondary | ICD-10-CM | POA: Diagnosis not present

## 2021-06-08 DIAGNOSIS — R399 Unspecified symptoms and signs involving the genitourinary system: Secondary | ICD-10-CM | POA: Insufficient documentation

## 2021-06-08 LAB — URINALYSIS, ROUTINE W REFLEX MICROSCOPIC
Bilirubin, UA: NEGATIVE
Glucose, UA: NEGATIVE
Ketones, UA: NEGATIVE
Nitrite, UA: NEGATIVE
Specific Gravity, UA: 1.025 (ref 1.005–1.030)
Urobilinogen, Ur: 0.2 mg/dL (ref 0.2–1.0)
pH, UA: 5.5 (ref 5.0–7.5)

## 2021-06-08 LAB — MICROSCOPIC EXAMINATION: WBC, UA: >30 /hpf — AB (ref 0–5)

## 2021-06-08 MED ORDER — NITROFURANTOIN MONOHYD MACRO 100 MG PO CAPS: 100.0000 mg | ORAL_CAPSULE | Freq: Two times a day (BID) | ORAL | 0 refills | Status: DC

## 2021-06-08 NOTE — Progress Notes (Signed)
Acute Office Visit  Subjective:    Patient ID: Dawn Acevedo, female    DOB: 22-Dec-1970, 50 y.o.   MRN: [id number]  Chief Complaint  Patient presents with   Dysuria    Dysuria  This is a new problem. Episode onset: in the past 4 days. The problem occurs every urination. The problem has been gradually worsening. The quality of the pain is described as aching and burning. The pain is moderate. There has been no fever. Associated symptoms include frequency. Pertinent negatives include no chills, discharge or flank pain. She has tried nothing for the symptoms.    Past Medical History:  Diagnosis Date   Anemia    Arthritis    RIGHT knee   History of vitamin D deficiency     Past Surgical History:  Procedure Laterality Date   DILITATION & CURRETTAGE/HYSTROSCOPY WITH NOVASURE ABLATION N/A 10/11/2014   Procedure: DILATATION & CURETTAGE/HYSTEROSCOPY WITH NOVASURE ABLATION;  Surgeon: Marvene Staff, MD;  Location: [facility];  Service: Gynecology;  Laterality: N/A;   LAPAROSCOPIC GASTRIC SLEEVE RESECTION     gastric sleeve   LAPAROSCOPIC TUBAL LIGATION Bilateral 10/11/2014   Procedure: LAPAROSCOPIC BILATERAL TUBAL LIGATION with Bipolar Cautery;  Surgeon: Marvene Staff, MD;  Location: [facility];  Service: Gynecology;  Laterality: Bilateral;   WISDOM TOOTH EXTRACTION      Family History  Problem Relation Age of Onset   Heart disease Mother    Heart attack Mother    Hypertension Mother    Heart attack Father    Diabetes Father    Hypertension Father    Prostate cancer Father    Ovarian cancer Maternal Grandmother    Colon cancer Neg Hx    Stomach cancer Neg Hx    Esophageal cancer Neg Hx    Liver disease Neg Hx    Pancreatic cancer Neg Hx    Colon polyps Neg Hx    Rectal cancer Neg Hx     Social History   Socioeconomic History   Marital status: Single    Spouse name: Not on file   Number of children: 2   Years of education: Not on file   Highest education  level: Some college, no degree  Occupational History   Not on file  Tobacco Use   Smoking status: Never   Smokeless tobacco: Never  Vaping Use   Vaping Use: Never used  Substance and Sexual Activity   Alcohol use: No    Comment: 1-2 glasses of wine per month   Drug use: No   Sexual activity: Not Currently    Birth control/protection: None  Other Topics Concern   Not on file  Social History Narrative   Not on file   Social Determinants of Health   Financial Resource Strain: Not on file  Food Insecurity: Not on file  Transportation Needs: Not on file  Physical Activity: Not on file  Stress: Not on file  Social Connections: Not on file  Intimate Partner Violence: Not on file    Outpatient Medications Prior to Visit  Medication Sig Dispense Refill   BINAXNOW COVID-19 AG HOME TEST KIT      cephALEXin (KEFLEX) 500 MG capsule Take 1 capsule (500 mg total) by mouth 4 (four) times daily. 20 capsule 0   phenazopyridine (PYRIDIUM) 200 MG tablet Take 1 tablet (200 mg total) by mouth 3 (three) times daily as needed for pain. 6 tablet 0   No facility-administered medications prior to visit.  Allergies  Allergen Reactions   Folic Acid Hives    Review of Systems  Constitutional:  Negative for chills.  HENT: Negative.    Cardiovascular: Negative.   Gastrointestinal: Negative.   Genitourinary:  Positive for dysuria and frequency. Negative for flank pain.  Musculoskeletal: Negative.   Skin:  Negative for rash.  All other systems reviewed and are negative.     Objective:    Physical Exam Vitals and nursing note reviewed.  Constitutional:      Appearance: Normal appearance.  HENT:     Head: Normocephalic.     Nose: Nose normal.  Eyes:     Conjunctiva/sclera: Conjunctivae normal.  Cardiovascular:     Pulses: Normal pulses.     Heart sounds: Normal heart sounds.  Pulmonary:     Effort: Pulmonary effort is normal.     Breath sounds: Normal breath sounds.  Abdominal:      General: Bowel sounds are normal.     Tenderness: There is no right CVA tenderness or left CVA tenderness.  Skin:    Findings: No rash.  Neurological:     Mental Status: She is alert and oriented to person, place, and time.  Psychiatric:        Behavior: Behavior normal.    BP 130/78   Pulse (!) 59   Temp 97.9 F (36.6 C) (Temporal)   Ht 5' 8"  (1.727 m)   Wt 205 lb (93 kg)   SpO2 97%   BMI 31.17 kg/m  Wt Readings from Last 3 Encounters:  06/08/21 205 lb (93 kg)  06/04/21 212 lb (96.2 kg)  06/02/21 208 lb (94.3 kg)    Health Maintenance Due  Topic Date Due   Pneumococcal Vaccine 33-13 Years old (1 - PCV) Never done   Zoster Vaccines- Shingrix (1 of 2) Never done   MAMMOGRAM  Never done   COVID-19 Vaccine (4 - Booster for Pfizer series) 02/07/2021    There are no preventive care reminders to display for this patient.   No results found for: TSH Lab Results  Component Value Date   WBC 6.6 01/04/2021   HGB 13.0 01/04/2021   HCT 38.6 01/04/2021   MCV 96.0 01/04/2021   PLT 261 01/04/2021   No results found for: NA, K, CHLORIDE, CO2, GLUCOSE, BUN, CREATININE, BILITOT, ALKPHOS, AST, ALT, PROT, ALBUMIN, CALCIUM, ANIONGAP, EGFR, GFR Lab Results  Component Value Date   CHOL 148 04/04/2021   Lab Results  Component Value Date   HDL 67 04/04/2021   Lab Results  Component Value Date   LDLCALC 72 04/04/2021   Lab Results  Component Value Date   TRIG 36 04/04/2021   Lab Results  Component Value Date   CHOLHDL 2.2 04/04/2021   No results found for: HGBA1C     Assessment & Plan:   Problem List Items Addressed This Visit       Other   Recent urinary tract infection - Primary   Relevant Medications   nitrofurantoin, macrocrystal-monohydrate, (MACROBID) 100 MG capsule   Other Relevant Orders   Urinalysis, Routine w reflex microscopic   Urine Culture   UTI symptoms    Abnormal urinalysis positive for nitrates blood and few bacteria.  Urine sent for  cultures results pending.   patient with Macrobid 100 mg twice daily for 7 days pending cultures.  Symptoms started past few days with pain and frequency.  Follow-up for worsening unresolved symptoms         Meds ordered this encounter  Medications   nitrofurantoin, macrocrystal-monohydrate, (MACROBID) 100 MG capsule    Sig: Take 1 capsule (100 mg total) by mouth 2 (two) times daily. 1 po BId    Dispense:  14 capsule    Refill:  0    Order Specific Question:   Supervising Provider    Answer:   Janora Norlander [[id number]]     Ivy Lynn, NP

## 2021-06-08 NOTE — Patient Instructions (Signed)
Dysuria ?Dysuria is pain or discomfort during urination. The pain or discomfort may be felt in the part of the body that drains urine from the bladder (urethra) or in the surrounding tissue of the genitals. The pain may also be felt in the groin area, lower abdomen, or lower back. ?You may have to urinate frequently or have the sudden feeling that you have to urinate (urgency). Dysuria can affect anyone, but it is more common in females. Dysuria can be caused by many different things, including: ?Urinary tract infection. ?Kidney stones or bladder stones. ?Certain STIs (sexually transmitted infections), such as chlamydia. ?Dehydration. ?Inflammation of the tissues of the vagina. ?Use of certain medicines. ?Use of certain soaps or scented products that cause irritation. ?Follow these instructions at home: ?Medicines ?Take over-the-counter and prescription medicines only as told by your health care provider. ?If you were prescribed an antibiotic medicine, take it as told by your health care provider. Do not stop taking the antibiotic even if you start to feel better. ?Eating and drinking ? ?Drink enough fluid to keep your urine pale yellow. ?Avoid caffeinated beverages, tea, and alcohol. These beverages can irritate the bladder and make dysuria worse. In males, alcohol may irritate the prostate. ?General instructions ?Watch your condition for any changes. ?Urinate often. Avoid holding urine for long periods of time. ?If you are female, you should wipe from front to back after urinating or having a bowel movement. Use each piece of toilet paper only once. ?Empty your bladder after sex. ?Keep all follow-up visits. This is important. ?If you had any tests done to find the cause of dysuria, it is up to you to get your test results. Ask your health care provider, or the department that is doing the test, when your results will be ready. ?Contact a health care provider if: ?You have a fever. ?You develop pain in your back or  sides. ?You have nausea or vomiting. ?You have blood in your urine. ?You are not urinating as often as you usually do. ?Get help right away if: ?Your pain is severe and not relieved with medicines. ?You cannot eat or drink without vomiting. ?You are confused. ?You have a rapid heartbeat while resting. ?You have shaking or chills. ?You feel extremely weak. ?Summary ?Dysuria is pain or discomfort while urinating. Many different conditions can lead to dysuria. ?If you have dysuria, you may have to urinate frequently or have the sudden feeling that you have to urinate (urgency). ?Watch your condition for any changes. Keep all follow-up visits. ?Make sure that you urinate often and drink enough fluid to keep your urine pale yellow. ?This information is not intended to replace advice given to you by your health care provider. Make sure you discuss any questions you have with your health care provider. ?Document Revised: 06/23/2020 Document Reviewed: 06/23/2020 ?Elsevier Patient Education ? 2022 [employer] ? ?

## 2021-06-08 NOTE — Assessment & Plan Note (Signed)
Abnormal urinalysis positive for nitrates blood and few bacteria.  Urine sent for cultures results pending.   patient with Macrobid 100 mg twice daily for 7 days pending cultures.  Symptoms started past few days with pain and frequency.  Follow-up for worsening unresolved symptoms

## 2021-06-10 LAB — URINE CULTURE

## 2021-06-13 ENCOUNTER — Encounter: Payer: Self-pay | Admitting: Gastroenterology

## 2021-07-04 ENCOUNTER — Inpatient Hospital Stay: Payer: PRIVATE HEALTH INSURANCE | Attending: Hematology and Oncology

## 2021-07-06 ENCOUNTER — Inpatient Hospital Stay: Payer: PRIVATE HEALTH INSURANCE | Admitting: Hematology and Oncology

## 2021-07-20 ENCOUNTER — Encounter: Payer: Self-pay | Admitting: Nurse Practitioner

## 2021-07-20 ENCOUNTER — Ambulatory Visit (INDEPENDENT_AMBULATORY_CARE_PROVIDER_SITE_OTHER): Payer: PRIVATE HEALTH INSURANCE | Admitting: Nurse Practitioner

## 2021-07-20 ENCOUNTER — Other Ambulatory Visit: Payer: Self-pay

## 2021-07-20 VITALS — BP 116/68 | HR 73 | Temp 98.0°F | Ht 68.0 in | Wt 214.0 lb

## 2021-07-20 DIAGNOSIS — M7750 Other enthesopathy of unspecified foot: Secondary | ICD-10-CM

## 2021-07-20 DIAGNOSIS — B354 Tinea corporis: Secondary | ICD-10-CM

## 2021-07-20 MED ORDER — IBUPROFEN 600 MG PO TABS: 600.0000 mg | ORAL_TABLET | Freq: Three times a day (TID) | ORAL | 0 refills | Status: DC | PRN

## 2021-07-20 MED ORDER — MICONAZOLE NITRATE 2 % EX CREA: 1.0000 "application " | TOPICAL_CREAM | Freq: Two times a day (BID) | CUTANEOUS | 2 refills | Status: DC

## 2021-07-20 NOTE — Progress Notes (Signed)
Acute Office Visit  Subjective:    Patient ID: Dawn Acevedo, female    DOB: [birth year], 50 y.o.   MRN: [id number]  Chief Complaint  Patient presents with   Foot Pain    Foot Pain This is a new problem. The current episode started in the past 7 days. The problem occurs constantly. The problem has been unchanged. Associated symptoms include a rash. Pertinent negatives include no abdominal pain, congestion, coughing, fatigue, headaches or sore throat. The symptoms are aggravated by walking.  Rash This is a new problem. The current episode started in the past 7 days. The problem has been gradually worsening since onset. The affected locations include the right foot. The rash is characterized by itchiness. It is unknown if there was an exposure to a precipitant. Pertinent negatives include no congestion, cough, fatigue or sore throat. Past treatments include nothing. The treatment provided no relief.    Past Medical History:  Diagnosis Date   Anemia    Arthritis    RIGHT knee   History of vitamin D deficiency     Past Surgical History:  Procedure Laterality Date   DILITATION & CURRETTAGE/HYSTROSCOPY WITH NOVASURE ABLATION N/A 10/11/2014   Procedure: DILATATION & CURETTAGE/HYSTEROSCOPY WITH NOVASURE ABLATION;  Surgeon: Marvene Staff, MD;  Location: [facility];  Service: Gynecology;  Laterality: N/A;   LAPAROSCOPIC GASTRIC SLEEVE RESECTION     gastric sleeve   LAPAROSCOPIC TUBAL LIGATION Bilateral 10/11/2014   Procedure: LAPAROSCOPIC BILATERAL TUBAL LIGATION with Bipolar Cautery;  Surgeon: Marvene Staff, MD;  Location: [facility];  Service: Gynecology;  Laterality: Bilateral;   WISDOM TOOTH EXTRACTION      Family History  Problem Relation Age of Onset   Heart disease Mother    Heart attack Mother    Hypertension Mother    Heart attack Father    Diabetes Father    Hypertension Father    Prostate cancer Father    Ovarian cancer Maternal Grandmother    Colon cancer Neg  Hx    Stomach cancer Neg Hx    Esophageal cancer Neg Hx    Liver disease Neg Hx    Pancreatic cancer Neg Hx    Colon polyps Neg Hx    Rectal cancer Neg Hx     Social History   Socioeconomic History   Marital status: Single    Spouse name: Not on file   Number of children: 2   Years of education: Not on file   Highest education level: Some college, no degree  Occupational History   Not on file  Tobacco Use   Smoking status: Never   Smokeless tobacco: Never  Vaping Use   Vaping Use: Never used  Substance and Sexual Activity   Alcohol use: No    Comment: 1-2 glasses of wine per month   Drug use: No   Sexual activity: Not Currently    Birth control/protection: None  Other Topics Concern   Not on file  Social History Narrative   Not on file   Social Determinants of Health   Financial Resource Strain: Not on file  Food Insecurity: Not on file  Transportation Needs: Not on file  Physical Activity: Not on file  Stress: Not on file  Social Connections: Not on file  Intimate Partner Violence: Not on file    Outpatient Medications Prior to Visit  Medication Sig Dispense Refill   nitrofurantoin, macrocrystal-monohydrate, (MACROBID) 100 MG capsule Take 1 capsule (100 mg total) by mouth 2 (two)  times daily. 1 po BId 14 capsule 0   No facility-administered medications prior to visit.    Allergies  Allergen Reactions   Folic Acid Hives    Review of Systems  Constitutional:  Negative for fatigue.  HENT:  Negative for congestion and sore throat.   Eyes: Negative.   Respiratory:  Negative for cough.   Gastrointestinal:  Negative for abdominal pain.  Genitourinary: Negative.   Skin:  Positive for rash.  Neurological:  Negative for headaches.  All other systems reviewed and are negative.     Objective:    Physical Exam Vitals and nursing note reviewed.  Constitutional:      Appearance: Normal appearance.  HENT:     Head: Normocephalic.     Right Ear: External  ear normal.     Left Ear: External ear normal.     Mouth/Throat:     Mouth: Mucous membranes are moist.  Eyes:     Conjunctiva/sclera: Conjunctivae normal.  Cardiovascular:     Rate and Rhythm: Normal rate and regular rhythm.     Pulses: Normal pulses.     Heart sounds: Normal heart sounds.  Pulmonary:     Effort: Pulmonary effort is normal.     Breath sounds: Normal breath sounds.  Abdominal:     General: Bowel sounds are normal.  Skin:    General: Skin is warm.     Findings: Rash present.  Neurological:     Mental Status: She is alert and oriented to person, place, and time.  Psychiatric:        Behavior: Behavior normal.    BP 116/68   Pulse 73   Temp 98 F (36.7 C) (Temporal)   Ht 5' 8"  (1.727 m)   Wt 214 lb (97.1 kg)   SpO2 98%   BMI 32.54 kg/m  Wt Readings from Last 3 Encounters:  07/20/21 214 lb (97.1 kg)  06/08/21 205 lb (93 kg)  06/04/21 212 lb (96.2 kg)    Health Maintenance Due  Topic Date Due   Pneumococcal Vaccine 31-46 Years old (1 - PCV) Never done   Zoster Vaccines- Shingrix (1 of 2) Never done   MAMMOGRAM  Never done   COVID-19 Vaccine (4 - Booster for Pfizer series) 02/07/2021   INFLUENZA VACCINE  06/25/2021    There are no preventive care reminders to display for this patient.   No results found for: TSH Lab Results  Component Value Date   WBC 6.6 01/04/2021   HGB 13.0 01/04/2021   HCT 38.6 01/04/2021   MCV 96.0 01/04/2021   PLT 261 01/04/2021   No results found for: NA, K, CHLORIDE, CO2, GLUCOSE, BUN, CREATININE, BILITOT, ALKPHOS, AST, ALT, PROT, ALBUMIN, CALCIUM, ANIONGAP, EGFR, GFR Lab Results  Component Value Date   CHOL 148 04/04/2021   Lab Results  Component Value Date   HDL 67 04/04/2021   Lab Results  Component Value Date   LDLCALC 72 04/04/2021   Lab Results  Component Value Date   TRIG 36 04/04/2021   Lab Results  Component Value Date   CHOLHDL 2.2 04/04/2021   No results found for: HGBA1C     Assessment &  Plan:   Problem List Items Addressed This Visit       Musculoskeletal and Integument   Bone spur of foot - Primary    New bone spur on right foot.  Slight tenderness, no tingling or numbness associated with right foot.  Completed x-ray.  Anti-inflammatory ibuprofen or Tylenol  as needed.  Cool compress follow-up with worsening unresolved symptoms.      Relevant Medications   ibuprofen (ADVIL) 600 MG tablet   Other Relevant Orders   DG Foot Complete Right   Tinea corporis    Symptoms not well controlled in the last 7 days.  Miconazole 2% topical cream, keep skin clean and dry.  Follow-up with worsening unresolved symptoms.  Rx sent to pharmacy.      Relevant Medications   miconazole (MICATIN) 2 % cream     Meds ordered this encounter  Medications   miconazole (MICATIN) 2 % cream    Sig: Apply 1 application topically 2 (two) times daily.    Dispense:  28.35 g    Refill:  2    Order Specific Question:   Supervising Provider    Answer:   Janora Norlander [[id number]]   ibuprofen (ADVIL) 600 MG tablet    Sig: Take 1 tablet (600 mg total) by mouth every 8 (eight) hours as needed.    Dispense:  30 tablet    Refill:  0    Order Specific Question:   Supervising Provider    Answer:   Janora Norlander [[id number]]     Ivy Lynn, NP

## 2021-07-20 NOTE — Patient Instructions (Addendum)
Bone Body Ringworm Body ringworm is an infection of the skin that often causes a ring-shaped rash.Body ringworm is also called tinea corporis. Body ringworm can affect any part of your skin. This condition is easily spread from person to person (is very contagious). What are the causes? This condition is caused by fungi called dermatophytes. The condition developswhen these fungi grow out of control on the skin. You can get this condition if you touch a person or animal that has it. You can also get it if you share any items with an infected person or pet. These include: Clothing, bedding, and towels. Brushes or combs. Gym equipment. Any other object that has the fungus on it. What increases the risk? You are more likely to develop this condition if you: Play sports that involve close physical contact, such as wrestling. Sweat a lot. Live in areas that are hot and humid. Use public showers. Have a weakened immune system. What are the signs or symptoms? Symptoms of this condition include: Itchy, raised red spots and bumps. Red scaly patches. A ring-shaped rash. The rash may have: A clear center. Scales or red bumps at its center. Redness near its borders. Dry and scaly skin on or around it. How is this diagnosed? This condition can usually be diagnosed with a skin exam. A skin scraping may be taken from the affected area and examined under a microscope to see if thefungus is present. How is this treated? This condition may be treated with: An antifungal cream or ointment. An antifungal shampoo. Antifungal medicines. These may be prescribed if your ringworm: Is severe. Keeps coming back. Lasts a long time. Follow these instructions at home: Take over-the-counter and prescription medicines only as told by your health care provider. If you were given an antifungal cream or ointment: Use it as told by your health care provider. Wash the infected area and dry it completely before  applying the cream or ointment. If you were given an antifungal shampoo: Use it as told by your health care provider. Leave the shampoo on your body for 3-5 minutes before rinsing. While you have a rash: Wear loose clothing to stop clothes from rubbing and irritating it. Wash or change your bed sheets every night. Disinfect or throw out items that may be infected. Wash clothes and bed sheets in hot water. Wash your hands often with soap and water. If soap and water are not available, use hand sanitizer. If your pet has the same infection, take your pet to see a veterinarian for treatment. How is this prevented? Take a bath or shower every day and after every time you work out or play sports. Dry your skin completely after bathing. Wear sandals or shoes in public places and showers. Change your clothes every day. Wash athletic clothes after each use. Do not share personal items with others. Avoid touching red patches of skin on other people. Avoid touching pets that have bald spots. If you touch an animal that has a bald spot, wash your hands. Contact a health care provider if: Your rash continues to spread after 7 days of treatment. Your rash is not gone in 4 weeks. The area around your rash gets red, warm, tender, and swollen. Summary Body ringworm is an infection of the skin that often causes a ring-shaped rash. This condition is easily spread from person to person (is very contagious). This condition may be treated with antifungal cream or ointment, antifungal shampoo, or antifungal medicines. Take over-the-counter and prescription medicines  only as told by your health care provider. This information is not intended to replace advice given to you by your health care provider. Make sure you discuss any questions you have with your healthcare provider. Document Revised: 07/10/2018 Document Reviewed: 07/10/2018 Elsevier Patient Education  2022 [place]  A heel spur is a  bony growth that forms on the bottom of the heel bone (calcaneus). Heel spurs are common. They often cause inflammation in the plantar fascia, which is the band of tissue that connects the toe bones to the heel bone. When the plantar fascia is inflamed, it is called plantar fasciitis. This may cause pain on the bottom of the foot, near the heel. Many people with plantar fasciitis also have heel spurs. However, spurs are not the cause of plantarfasciitis pain. What are the causes? The exact cause of heel spurs is not known. They may be caused by: Pressure on the heel bone. Bands of tissues that connect muscle to bone (tendons) pulling on the heel bone. What increases the risk? You are more likely to develop this condition if you: Are older than age 51. Are overweight. Have wear-and-tear arthritis (osteoarthritis). Have plantar fascia inflammation. Participate in sports or activities that include a lot of running or jumping. Wear poorly fitted shoes. What are the signs or symptoms? Some people have no symptoms. If you do have symptoms, they may include: Pain in the bottom of your heel. Pain that is worse when you first get out of bed. Pain that gets worse after walking or standing. How is this diagnosed? This condition may be diagnosed based on: Your symptoms and medical history. A physical exam. A foot X-ray. How is this treated? Treatment for this condition depends on how much pain you have. Treatment options may include: Doing stretching exercises and losing weight, if necessary. Wearing specific shoes or inserts inside of shoes (orthotics) for comfort and support. Wearing splints on your feet while you sleep. Splints keep your feet in a position (usually a 90-degree angle) that should prevent and relieve the pain you feel when you first get out of bed. They also make stretching easier in the morning. Taking over-the-counter medicine to relieve pain, such as NSAIDs. Using high-intensity  sound waves to break up the heel spur (extracorporeal shock wave therapy). Getting steroid injections in your heel to reduce inflammation. Having surgery, if your heel spur causes long-term (chronic) pain. Follow these instructions at home:  Activity Avoid activities that cause pain until you recover, or for as long as told by your health care provider. Do stretching exercises as told. Stretch before exercising or being physically active. Managing pain, stiffness, and swelling If directed, put ice on your foot. To do this: Put ice in a plastic bag. Place a towel between your skin and the bag. Leave the ice on for 20 minutes, 2-3 times a day. Remove the ice if your skin turns bright red. This is very important. If you cannot feel pain, heat, or cold, you have a greater risk of damage to the area. Move your toes often to reduce stiffness and swelling. When possible, raise (elevate) your foot above the level of your heart while you are sitting or lying down. General instructions Take over-the-counter and prescription medicines only as told by your health care provider. Wear supportive shoes that fit well. Wear splints, inserts, or orthotics as told by your health care provider. If recommended, work with your health care provider to lose weight. This can relieve pressure  on your foot. Do not use any products that contain nicotine or tobacco, such as cigarettes, e-cigarettes, and chewing tobacco. These can delay bone healing. If you need help quitting, ask your health care provider. Keep all follow-up visits. This is important. Where to find more information American Academy of Orthopaedic Surgeons: www.orthoinfo.aaos.org Contact a health care provider if: Your pain does not go away with treatment. Your pain gets worse. Summary A heel spur is a bony growth that forms on the bottom of the heel bone (calcaneus). Heel spurs often cause inflammation in the plantar fascia, which is the band of  tissue that connects the toes to the heel bone. This may cause pain on the bottom of the foot, near the heel. Doing stretching exercises, losing weight, wearing specific shoes or shoe inserts, wearing splints while you sleep, and taking pain medicine may ease the pain and stiffness. Other treatment options may include high-intensity sound waves to break up the heel spur, steroid injections, or surgery. This information is not intended to replace advice given to you by your health care provider. Make sure you discuss any questions you have with your healthcare provider. Document Revised: 03/07/2020 Document Reviewed: 03/07/2020 Elsevier Patient Education  [place].

## 2021-07-20 NOTE — Assessment & Plan Note (Signed)
Symptoms not well controlled in the last 7 days.  Miconazole 2% topical cream, keep skin clean and dry.  Follow-up with worsening unresolved symptoms.  Rx sent to pharmacy.

## 2021-07-20 NOTE — Assessment & Plan Note (Signed)
New bone spur on right foot.  Slight tenderness, no tingling or numbness associated with right foot.  Completed x-ray.  Anti-inflammatory ibuprofen or Tylenol as needed.  Cool compress follow-up with worsening unresolved symptoms.

## 2021-07-23 ENCOUNTER — Telehealth: Payer: Self-pay | Admitting: Nurse Practitioner

## 2021-07-23 NOTE — Telephone Encounter (Signed)
Pt called wanting to know when she is supposed to come in to have her xray that's been ordered when she works from 6:30am to 2:30pm in [place].   Pt also wants to know why she was prescribed vaginal cream but told she had ring worm?  Please advise and call patient.

## 2021-07-24 NOTE — Telephone Encounter (Signed)
Pt was given monistat by pharmacy, explained to pt that it is the same med and to use it topically. She can come before 5 anyday for xray

## 2021-08-22 ENCOUNTER — Ambulatory Visit (INDEPENDENT_AMBULATORY_CARE_PROVIDER_SITE_OTHER): Payer: PRIVATE HEALTH INSURANCE | Admitting: Nurse Practitioner

## 2021-08-22 ENCOUNTER — Encounter: Payer: Self-pay | Admitting: Nurse Practitioner

## 2021-08-22 ENCOUNTER — Other Ambulatory Visit: Payer: Self-pay

## 2021-08-22 VITALS — BP 145/82 | HR 56 | Temp 98.1°F | Resp 20 | Ht 68.0 in | Wt 214.0 lb

## 2021-08-22 DIAGNOSIS — F4381 Prolonged grief disorder: Secondary | ICD-10-CM | POA: Insufficient documentation

## 2021-08-22 DIAGNOSIS — F4321 Adjustment disorder with depressed mood: Secondary | ICD-10-CM | POA: Diagnosis not present

## 2021-08-22 DIAGNOSIS — Z23 Encounter for immunization: Secondary | ICD-10-CM

## 2021-08-22 NOTE — Assessment & Plan Note (Signed)
Provided support and listening, completed referral to psychology for grief counseling. Advised patient it will be beneficial for her 51 year old son to have counseling as well.    Follow up with worsening or unresolved symptoms

## 2021-08-22 NOTE — Progress Notes (Signed)
 Acute Office Visit  Subjective:    Patient ID: Dawn Acevedo, female    DOB: 02/23/1971, 50 y.o.   MRN: [id number]  Chief Complaint  Patient presents with   Discuss mental health    HPI Patient is in today for uncomplicated grief. Patient reports loosing her 17 year old son last year to an overdose and since then she has not been able to overcome her grief, prior to the loss of her son, patient lost her mother and her  father, it is difficult processing and holding everything together and now patient is asking for psychiatry referral for anxiety and depression.  Flowsheet Row Office Visit from 08/22/2021 in [facility]  PHQ-9 Total Score 8      GAD 7 : Generalized Anxiety Score 08/22/2021  Nervous, Anxious, on Edge 0  Control/stop worrying 0  Worry too much - different things 0  Trouble relaxing 1  Restless 0  Easily annoyed or irritable 2  Afraid - awful might happen 2  Total GAD 7 Score 5  Anxiety Difficulty Somewhat difficult       Past Medical History:  Diagnosis Date   Anemia    Arthritis    RIGHT knee   History of vitamin D deficiency     Past Surgical History:  Procedure Laterality Date   DILITATION & CURRETTAGE/HYSTROSCOPY WITH NOVASURE ABLATION N/A 10/11/2014   Procedure: DILATATION & CURETTAGE/HYSTEROSCOPY WITH NOVASURE ABLATION;  Surgeon: Sheronette A Cousins, MD;  Location: [facility];  Service: Gynecology;  Laterality: N/A;   LAPAROSCOPIC GASTRIC SLEEVE RESECTION     gastric sleeve   LAPAROSCOPIC TUBAL LIGATION Bilateral 10/11/2014   Procedure: LAPAROSCOPIC BILATERAL TUBAL LIGATION with Bipolar Cautery;  Surgeon: Sheronette A Cousins, MD;  Location: [facility];  Service: Gynecology;  Laterality: Bilateral;   WISDOM TOOTH EXTRACTION      Family History  Problem Relation Age of Onset   Heart disease Mother    Heart attack Mother    Hypertension Mother    Heart attack Father    Diabetes Father    Hypertension Father    Prostate  cancer Father    Ovarian cancer Maternal Grandmother    Colon cancer Neg Hx    Stomach cancer Neg Hx    Esophageal cancer Neg Hx    Liver disease Neg Hx    Pancreatic cancer Neg Hx    Colon polyps Neg Hx    Rectal cancer Neg Hx     Social History   Socioeconomic History   Marital status: Single    Spouse name: Not on file   Number of children: 2   Years of education: Not on file   Highest education level: Some college, no degree  Occupational History   Not on file  Tobacco Use   Smoking status: Never   Smokeless tobacco: Never  Vaping Use   Vaping Use: Never used  Substance and Sexual Activity   Alcohol use: No    Comment: 1-2 glasses of wine per month   Drug use: No   Sexual activity: Not Currently    Birth control/protection: None  Other Topics Concern   Not on file  Social History Narrative   Not on file   Social Determinants of Health   Financial Resource Strain: Not on file  Food Insecurity: Not on file  Transportation Needs: Not on file  Physical Activity: Not on file  Stress: Not on file  Social Connections: Not on file  Intimate Partner Violence:   Not on file    Outpatient Medications Prior to Visit  Medication Sig Dispense Refill   ibuprofen (ADVIL) 600 MG tablet Take 1 tablet (600 mg total) by mouth every 8 (eight) hours as needed. (Patient not taking: Reported on 08/22/2021) 30 tablet 0   miconazole (MICATIN) 2 % cream Apply 1 application topically 2 (two) times daily. (Patient not taking: Reported on 08/22/2021) 28.35 g 2   No facility-administered medications prior to visit.    Allergies  Allergen Reactions   Folic Acid Hives    Review of Systems  Constitutional: Negative.   HENT: Negative.    Respiratory: Negative.    Cardiovascular: Negative.   Gastrointestinal: Negative.   Musculoskeletal: Negative.   Skin:  Negative for rash.  Psychiatric/Behavioral:  Negative for suicidal ideas. The patient is nervous/anxious.   All other systems  reviewed and are negative.     Objective:    Physical Exam Vitals and nursing note reviewed.  Constitutional:      Appearance: Normal appearance.  HENT:     Head: Normocephalic.     Mouth/Throat:     Mouth: Mucous membranes are moist.     Pharynx: Oropharynx is clear.  Eyes:     Conjunctiva/sclera: Conjunctivae normal.  Cardiovascular:     Rate and Rhythm: Normal rate and regular rhythm.     Pulses: Normal pulses.     Heart sounds: Normal heart sounds.  Pulmonary:     Effort: Pulmonary effort is normal.     Breath sounds: Normal breath sounds.  Abdominal:     General: Bowel sounds are normal.  Skin:    Findings: No rash.  Neurological:     Mental Status: She is alert and oriented to person, place, and time.  Psychiatric:        Attention and Perception: Attention and perception normal.        Mood and Affect: Mood is anxious and depressed.        Behavior: Behavior is cooperative.    BP (!) 145/82   Pulse (!) 56   Temp 98.1 F (36.7 C) (Temporal)   Resp 20   Ht 5' 8" (1.727 m)   Wt 214 lb (97.1 kg)   SpO2 100%   BMI 32.54 kg/m  Wt Readings from Last 3 Encounters:  08/22/21 214 lb (97.1 kg)  07/20/21 214 lb (97.1 kg)  06/08/21 205 lb (93 kg)    Health Maintenance Due  Topic Date Due   Zoster Vaccines- Shingrix (1 of 2) Never done   MAMMOGRAM  Never done   COVID-19 Vaccine (4 - Booster for Pfizer series) 02/01/2021    There are no preventive care reminders to display for this patient.   No results found for: TSH Lab Results  Component Value Date   WBC 6.6 01/04/2021   HGB 13.0 01/04/2021   HCT 38.6 01/04/2021   MCV 96.0 01/04/2021   PLT 261 01/04/2021   No results found for: NA, K, CHLORIDE, CO2, GLUCOSE, BUN, CREATININE, BILITOT, ALKPHOS, AST, ALT, PROT, ALBUMIN, CALCIUM, ANIONGAP, EGFR, GFR Lab Results  Component Value Date   CHOL 148 04/04/2021   Lab Results  Component Value Date   HDL 67 04/04/2021   Lab Results  Component Value Date    LDLCALC 72 04/04/2021   Lab Results  Component Value Date   TRIG 36 04/04/2021   Lab Results  Component Value Date   CHOLHDL 2.2 04/04/2021   No results found for: HGBA1C       Assessment & Plan:   Problem List Items Addressed This Visit       Other   Complicated grief - Primary    Provided support and listening, completed referral to psychology for grief counseling. Advised patient it will be beneficial for her 15 year old son to have counseling as well.    Follow up with worsening or unresolved symptoms       Relevant Orders   Ambulatory referral to Psychology   Other Visit Diagnoses     Need for immunization against influenza       Relevant Orders   Flu Vaccine QUAD 6mo+IM (Fluarix, Fluzone & Alfiuria Quad PF) (Completed)        No orders of the defined types were placed in this encounter.    Onyeje M Ijaola, NP  

## 2021-08-22 NOTE — Patient Instructions (Signed)
Managing Loss, Adult People experience loss in many different ways throughout their lives. Events such as moving, changing jobs, and losing friends can create a sense of loss. The loss may be as serious as a major health change, divorce, death of a pet, or death of a loved one. All of these types of loss are likely to create a physical and emotional reaction known as grief. Grief is the result of a major change or an absence of something or someone that you count on. Grief is a normal reaction to loss. A variety of factors can affect your grieving experience, including: The nature of your loss. Your relationship to what or whom you lost. Your understanding of grief and how to manage it. Your support system. How to manage lifestyle changes Keep to your normal routine as much as possible. If you have trouble focusing or doing normal activities, it is acceptable to take some time away from your normal routine. Spend time with friends and loved ones. Eat a healthy diet, get plenty of sleep, and rest when you feel tired. How to recognize changes  The way that you deal with your grief will affect your ability to function as you normally do. When grieving, you may experience these changes: Numbness, shock, sadness, anxiety, anger, denial, and guilt. Thoughts about death. Unexpected crying. A physical sensation of emptiness in your stomach. Problems sleeping and eating. Tiredness (fatigue). Loss of interest in normal activities. Dreaming about or imagining seeing the person who died. A need to remember what or whom you lost. Difficulty thinking about anything other than your loss for a period of time. Relief. If you have been expecting the loss for a while, you may feel a sense of relief when it happens. Follow these instructions at home: Activity Express your feelings in healthy ways, such as: Talking with others about your loss. It may be helpful to find others who have had a similar loss, such  as a support group. Writing down your feelings in a journal. Doing physical activities to release stress and emotional energy. Doing creative activities like painting, sculpting, or playing or listening to music. Practicing resilience. This is the ability to recover and adjust after facing challenges. Reading some resources that encourage resilience may help you to learn ways to practice those behaviors.  General instructions Be patient with yourself and others. Allow the grieving process to happen, and remember that grieving takes time. It is likely that you may never feel completely done with some grief. You may find a way to move on while still cherishing memories and feelings about your loss. Accepting your loss is a process. It can take months or longer to adjust. Keep all follow-up visits as told by your health care provider. This is important. Where to find support To get support for managing loss: Ask your health care provider for help and recommendations, such as grief counseling or therapy. Think about joining a support group for people who are managing a loss. Where to find more information You can find more information about managing loss from: American Society of Clinical Oncology: www.cancer.net American Psychological Association: www.apa.org Contact a health care provider if: Your grief is extreme and keeps getting worse. You have ongoing grief that does not improve. Your body shows symptoms of grief, such as illness. You feel depressed, anxious, or lonely. Get help right away if: You have thoughts about hurting yourself or others. If you ever feel like you may hurt yourself or others, or have   thoughts about taking your own life, get help right away. You can go to your nearest emergency department or call: Your local emergency services (911 in the [place] A suicide crisis helpline, such as the [place] at (803)759-6687. This is open 24 hours a  day. Summary Grief is the result of a major change or an absence of someone or something that you count on. Grief is a normal reaction to loss. The depth of grief and the period of recovery depend on the type of loss and your ability to adjust to the change and process your feelings. Processing grief requires patience and a willingness to accept your feelings and talk about your loss with people who are supportive. It is important to find resources that work for you and to realize that people experience grief differently. There is not one grieving process that works for everyone in the same way. Be aware that when grief becomes extreme, it can lead to more severe issues like isolation, depression, anxiety, or suicidal thoughts. Talk with your health care provider if you have any of these issues. This information is not intended to replace advice given to you by your health care provider. Make sure you discuss any questions you have with your health care provider. Document Revised: 05/04/2020 Document Reviewed: 05/04/2020 Elsevier Patient Education  2022 [place]. Complicated Grief Grief is a normal response to the death of someone close to you. Feelings of fear, anger, and guilt can affect almost everyone who loses a loved one. It is also common to have symptoms of depression while you are grieving. These include problems with sleep, loss of appetite, and lack of energy. They may last for weeks or months after a loss. Complicated grief is different from normal grief or depression. Normal grieving involves sadness and feelings of loss, but those feelings get better and heal over time. Complicated grief is a severe type of grief that lasts for a long time, usually for several months to a year or longer. It interferes with your ability to function normally. Complicated grief may require treatment from a mental health care provider. What are the causes? The cause of this condition is not known. It is  not clear why some people continue to struggle with grief and others do not. What increases the risk? You are more likely to develop this condition if: The death of your loved one was sudden or unexpected. The death of your loved one was due to a violent event. Your loved one died from suicide. Your loved one was a child or a young person. You were very close to your loved one, or you were dependent on him or her. You have a history of depression or anxiety. What are the signs or symptoms? Symptoms of this condition include: Feeling disbelief or having a lack of emotion (numbness). Being unable to enjoy good memories of your loved one. Needing to avoid anything or anyone that reminds you of your loved one. Being unable to stop thinking about the death. Feeling intense anger or guilt. Feeling alone and hopeless. Feeling that your life is meaningless and empty. Losing the desire to move on with your life. How is this diagnosed? This condition may be diagnosed based on: Your symptoms. Complicated grief will be diagnosed if you have ongoing symptoms of grief for 6-12 months or longer. The effect of symptoms on your life. You may be diagnosed with this condition if your symptoms are interfering with your ability to live  your life. Your health care provider may recommend that you see a mental health care provider. Many symptoms of depression are similar to the symptoms of complicated grief. It is important to be evaluated for complicated grief along with other mental health conditions. How is this treated? This condition is most commonly treated with talk therapy. This therapy is offered by a mental health specialist (psychiatrist). During therapy: You will learn healthy ways to cope with the loss of your loved one. Your mental health care provider may recommend antidepressant medicines. Follow these instructions at home: Lifestyle  Take care of yourself. Eat on a regular basis, and  maintain a healthy diet. Eat plenty of fruits, vegetables, lean protein, and whole grains. Try to get some exercise each day. Aim for 30 minutes of exercise on most days of the week. Keep a consistent sleep schedule. Try to get 8 or more hours of sleep each night. Start doing the things that you used to enjoy. Do not use drugs or alcohol to ease your symptoms. Spend time with friends and loved ones. General instructions Take over-the-counter and prescription medicines only as told by your health care provider. Consider joining a grief (bereavement) support group to help you deal with your loss. Keep all follow-up visits as told by your health care provider. This is important. Contact a health care provider if: Your symptoms prevent you from functioning normally. Your symptoms do not get better with treatment. Get help right away if: You have serious thoughts about hurting yourself or someone else. You have suicidal feelings. If you ever feel like you may hurt yourself or others, or have thoughts about taking your own life, get help right away. You can go to your nearest emergency department or call: Your local emergency services (911 in the [place] A suicide crisis helpline, such as the [facility] at 334-717-1334. This is open 24 hours a day. Summary Complicated grief is a severe type of grief that lasts for a long time. This grief is not likely to go away on its own. Get the help you need. Some griefs are more difficult than others and can cause this condition. You may need a certain type of treatment to help you recover if the loss of your loved one was sudden, violent, or due to suicide. You may feel guilty about moving on with your life. Getting help does not mean that you are forgetting your loved one. It means that you are taking care of yourself. Complicated grief is best treated with talk therapy. Medicines may also be prescribed. Seek the help you need, and  find support that will help you recover. This information is not intended to replace advice given to you by your health care provider. Make sure you discuss any questions you have with your health care provider. Document Revised: 05/04/2020 Document Reviewed: 05/04/2020 Elsevier Patient Education  [place].

## 2021-10-07 DIAGNOSIS — Z9889 Other specified postprocedural states: Secondary | ICD-10-CM | POA: Insufficient documentation

## 2021-10-07 DIAGNOSIS — Z9884 Bariatric surgery status: Secondary | ICD-10-CM | POA: Insufficient documentation

## 2021-12-25 ENCOUNTER — Other Ambulatory Visit: Payer: Self-pay

## 2021-12-25 ENCOUNTER — Encounter: Payer: Self-pay | Admitting: Nurse Practitioner

## 2021-12-25 ENCOUNTER — Ambulatory Visit (INDEPENDENT_AMBULATORY_CARE_PROVIDER_SITE_OTHER): Payer: PRIVATE HEALTH INSURANCE | Admitting: Nurse Practitioner

## 2021-12-25 VITALS — BP 137/85 | HR 56 | Temp 98.7°F | Ht 68.0 in | Wt 217.0 lb

## 2021-12-25 DIAGNOSIS — R634 Abnormal weight loss: Secondary | ICD-10-CM | POA: Diagnosis not present

## 2021-12-25 MED ORDER — WEGOVY 0.25 MG/0.5ML ~~LOC~~ SOAJ: 0.2500 mg | SUBCUTANEOUS | 3 refills | Status: DC

## 2021-12-25 NOTE — Patient Instructions (Signed)

## 2021-12-25 NOTE — Progress Notes (Signed)
Established Patient Office Visit  Subjective:  Patient ID: Dawn Acevedo, female    DOB: 11/05/1971  Age: 51 y.o. MRN: [id number]  CC:  Chief Complaint  Patient presents with   Medication Management    Discuss weight loss medication    HPI Dawn Acevedo presents for Obesity: Patient complains of obesity. Patient cites health, increased physical ability, self-image as reasons for wanting to lose weight.  Obesity History Weight in late teens: Patient cannot remember Period of greatest weight gain: 264 lb during mid adult years Lowest adult weight: 50 lb Highest adult weight: 264 lb Amount of time at present weight: too long to remember   History of Weight Loss Efforts Greatest amount of weight lost: 50 lb over 6 months Amount of time that loss was maintained: few years Circumstances associated with regain of weight: eating habits Successful weight loss techniques attempted: surgical procedure:   Unsuccessful weight loss techniques attempted: self-directed dieting and surgical procedure:    Current Exercise Habits none  Current Eating Habits Number of regular meals per day: 3 Number of snacking episodes per day: 2 Who shops for food? patient Who prepares food? patient Who eats with patient? patient and son Binge behavior?: no Purge behavior? no Anorexic behavior? no Eating precipitated by stress? no Guilt feelings associated with eating? no  Other Potential Contributing Factors Use of alcohol: average no drinks/week Use of medications that may cause weight gain none History of past abuse? none Psych History: anxiety and depression Comorbidities: none   Past Medical History:  Diagnosis Date   Anemia    Arthritis    RIGHT knee   History of vitamin D deficiency     Past Surgical History:  Procedure Laterality Date   DILITATION & CURRETTAGE/HYSTROSCOPY WITH NOVASURE ABLATION N/A 10/11/2014   Procedure: DILATATION & CURETTAGE/HYSTEROSCOPY WITH NOVASURE  ABLATION;  Surgeon: Marvene Staff, MD;  Location: [facility];  Service: Gynecology;  Laterality: N/A;   LAPAROSCOPIC GASTRIC SLEEVE RESECTION     gastric sleeve   LAPAROSCOPIC TUBAL LIGATION Bilateral 10/11/2014   Procedure: LAPAROSCOPIC BILATERAL TUBAL LIGATION with Bipolar Cautery;  Surgeon: Marvene Staff, MD;  Location: [facility];  Service: Gynecology;  Laterality: Bilateral;   WISDOM TOOTH EXTRACTION      Family History  Problem Relation Age of Onset   Heart disease Mother    Heart attack Mother    Hypertension Mother    Heart attack Father    Diabetes Father    Hypertension Father    Prostate cancer Father    Ovarian cancer Maternal Grandmother    Colon cancer Neg Hx    Stomach cancer Neg Hx    Esophageal cancer Neg Hx    Liver disease Neg Hx    Pancreatic cancer Neg Hx    Colon polyps Neg Hx    Rectal cancer Neg Hx     Social History   Socioeconomic History   Marital status: Single    Spouse name: Not on file   Number of children: 2   Years of education: Not on file   Highest education level: Some college, no degree  Occupational History   Not on file  Tobacco Use   Smoking status: Never   Smokeless tobacco: Never  Vaping Use   Vaping Use: Never used  Substance and Sexual Activity   Alcohol use: No    Comment: 1-2 glasses of wine per month   Drug use: No   Sexual activity: Not Currently  Birth control/protection: None  Other Topics Concern   Not on file  Social History Narrative   Not on file   Social Determinants of Health   Financial Resource Strain: Not on file  Food Insecurity: Not on file  Transportation Needs: Not on file  Physical Activity: Not on file  Stress: Not on file  Social Connections: Not on file  Intimate Partner Violence: Not on file    Outpatient Medications Prior to Visit  Medication Sig Dispense Refill   ibuprofen (ADVIL) 600 MG tablet Take 1 tablet (600 mg total) by mouth every 8 (eight) hours as needed. (Patient  not taking: Reported on 08/22/2021) 30 tablet 0   clotrimazole (LOTRIMIN) 1 % cream Apply topically 2 (two) times daily.     miconazole (MICATIN) 2 % cream Apply 1 application topically 2 (two) times daily. (Patient not taking: Reported on 08/22/2021) 28.35 g 2   No facility-administered medications prior to visit.    Allergies  Allergen Reactions   Folic Acid Hives    ROS Review of Systems  Constitutional: Negative.   HENT: Negative.    Eyes: Negative.   Respiratory: Negative.    Cardiovascular: Negative.   Gastrointestinal: Negative.   Genitourinary: Negative.   Musculoskeletal: Negative.   Skin: Negative.  Negative for rash.  All other systems reviewed and are negative.    Objective:    Physical Exam Vitals reviewed.  Constitutional:      Appearance: She is obese.  HENT:     Right Ear: External ear normal.     Left Ear: External ear normal.     Nose: Nose normal.     Mouth/Throat:     Pharynx: Oropharynx is clear.  Eyes:     Conjunctiva/sclera: Conjunctivae normal.  Cardiovascular:     Pulses: Normal pulses.  Pulmonary:     Effort: Pulmonary effort is normal.     Breath sounds: Normal breath sounds.  Abdominal:     General: Bowel sounds are normal.  Skin:    General: Skin is warm.     Findings: No rash.  Neurological:     General: No focal deficit present.     Mental Status: She is alert and oriented to person, place, and time.  Psychiatric:        Behavior: Behavior normal.    BP 137/85    Pulse (!) 56    Temp 98.7 F (37.1 C)    Ht 5' 8"  (1.727 m)    Wt 217 lb (98.4 kg)    SpO2 98%    BMI 32.99 kg/m  Wt Readings from Last 3 Encounters:  12/25/21 217 lb (98.4 kg)  08/22/21 214 lb (97.1 kg)  07/20/21 214 lb (97.1 kg)     Health Maintenance Due  Topic Date Due   MAMMOGRAM  Never done    There are no preventive care reminders to display for this patient.  No results found for: TSH Lab Results  Component Value Date   WBC 6.6 01/04/2021   HGB  13.0 01/04/2021   HCT 38.6 01/04/2021   MCV 96.0 01/04/2021   PLT 261 01/04/2021   No results found for: NA, K, CHLORIDE, CO2, GLUCOSE, BUN, CREATININE, BILITOT, ALKPHOS, AST, ALT, PROT, ALBUMIN, CALCIUM, ANIONGAP, EGFR, GFR Lab Results  Component Value Date   CHOL 148 04/04/2021   Lab Results  Component Value Date   HDL 67 04/04/2021   Lab Results  Component Value Date   LDLCALC 72 04/04/2021   Lab Results  Component Value Date   TRIG 36 04/04/2021   Lab Results  Component Value Date   CHOLHDL 2.2 04/04/2021   No results found for: HGBA1C    Assessment & Plan:  Patient continues to gain weight after surgical procedure diet and exercise.  BMI currently 32.99 kg/mm.  Started patient on Wegovy 0.25 mg/5 ml once weekly.  Follow-up in 4 weeks.  Education provided to patient that medication has to be combined with exercise and diet.  Patient verbalized understanding.  Medication sent to pharmacy. Problem List Items Addressed This Visit   None Visit Diagnoses     Weight loss    -  Primary   Relevant Medications   Semaglutide-Weight Management (WEGOVY) 0.25 MG/0.5ML SOAJ   Other Relevant Orders   CBC with Differential   Lipid Panel   Comprehensive metabolic panel       Meds ordered this encounter  Medications   Semaglutide-Weight Management (WEGOVY) 0.25 MG/0.5ML SOAJ    Sig: Inject 0.25 mg into the skin once a week. Once weekly for 4 weeks    Dispense:  2 mL    Refill:  3    Order Specific Question:   Supervising Provider    Answer:   Claretta Fraise [[id number]]    Follow-up: Return in about 4 weeks (around 01/22/2022).    Ivy Lynn, NP

## 2021-12-26 ENCOUNTER — Telehealth: Payer: Self-pay | Admitting: *Deleted

## 2021-12-26 LAB — CBC WITH DIFFERENTIAL/PLATELET
Basophils Absolute: 0 10*3/uL (ref 0.0–0.2)
Basos: 1 %
EOS (ABSOLUTE): 0.2 10*3/uL (ref 0.0–0.4)
Eos: 3 %
Hematocrit: 38.7 % (ref 34.0–46.6)
Hemoglobin: 12.9 g/dL (ref 11.1–15.9)
Immature Grans (Abs): 0 10*3/uL (ref 0.0–0.1)
Immature Granulocytes: 0 %
Lymphocytes Absolute: 2.5 10*3/uL (ref 0.7–3.1)
Lymphs: 36 %
MCH: 32.1 pg (ref 26.6–33.0)
MCHC: 33.3 g/dL (ref 31.5–35.7)
MCV: 96 fL (ref 79–97)
Monocytes Absolute: 0.6 10*3/uL (ref 0.1–0.9)
Monocytes: 9 %
Neutrophils Absolute: 3.5 10*3/uL (ref 1.4–7.0)
Neutrophils: 51 %
Platelets: 263 10*3/uL (ref 150–450)
RBC: 4.02 x10E6/uL (ref 3.77–5.28)
RDW: 12.1 % (ref 11.7–15.4)
WBC: 6.9 10*3/uL (ref 3.4–10.8)

## 2021-12-26 LAB — COMPREHENSIVE METABOLIC PANEL
ALT: 44 IU/L — ABNORMAL HIGH (ref 0–32)
AST: 29 IU/L (ref 0–40)
Albumin/Globulin Ratio: 1.4 (ref 1.2–2.2)
Albumin: 4 g/dL (ref 3.8–4.9)
Alkaline Phosphatase: 124 IU/L — ABNORMAL HIGH (ref 44–121)
BUN/Creatinine Ratio: 12 (ref 9–23)
BUN: 11 mg/dL (ref 6–24)
Bilirubin Total: 0.3 mg/dL (ref 0.0–1.2)
CO2: 23 mmol/L (ref 20–29)
Calcium: 8.6 mg/dL — ABNORMAL LOW (ref 8.7–10.2)
Chloride: 106 mmol/L (ref 96–106)
Creatinine, Ser: 0.95 mg/dL (ref 0.57–1.00)
Globulin, Total: 2.8 g/dL (ref 1.5–4.5)
Glucose: 88 mg/dL (ref 70–99)
Potassium: 4.5 mmol/L (ref 3.5–5.2)
Sodium: 142 mmol/L (ref 134–144)
Total Protein: 6.8 g/dL (ref 6.0–8.5)
eGFR: 73 mL/min/{1.73_m2} (ref >59)

## 2021-12-26 LAB — LIPID PANEL
Chol/HDL Ratio: 2.2 ratio (ref 0.0–4.4)
Cholesterol, Total: 142 mg/dL (ref 100–199)
HDL: 64 mg/dL (ref >39)
LDL Chol Calc (NIH): 66 mg/dL (ref 0–99)
Triglycerides: 56 mg/dL (ref 0–149)
VLDL Cholesterol Cal: 12 mg/dL (ref 5–40)

## 2021-12-26 NOTE — Telephone Encounter (Signed)
Prior auth submitted  Dawn Acevedo (Key: [id number])  Rx #: [id number]  PPJKDT 0.25MG /0.5ML auto-injectors

## 2021-12-26 NOTE — Telephone Encounter (Signed)
[facility]  Approved   DRUG Wegovy Inj 0.25mg  NAME: Dawn Acevedo PATIENT Member ID: [id number] NAME: Case number: [id number] NEXT You can now fill your prescription for this medication. STEPS: VALID 12/26/2021 - 06/25/2022   Pharmacy aware  Left message on pt VM that medication approved.

## 2022-01-15 DIAGNOSIS — M25561 Pain in right knee: Secondary | ICD-10-CM | POA: Insufficient documentation

## 2022-01-23 ENCOUNTER — Ambulatory Visit: Payer: PRIVATE HEALTH INSURANCE | Admitting: Nurse Practitioner

## 2022-02-12 ENCOUNTER — Ambulatory Visit (INDEPENDENT_AMBULATORY_CARE_PROVIDER_SITE_OTHER): Payer: PRIVATE HEALTH INSURANCE | Admitting: Nurse Practitioner

## 2022-02-12 ENCOUNTER — Encounter: Payer: Self-pay | Admitting: Nurse Practitioner

## 2022-02-12 VITALS — BP 126/82 | HR 53 | Temp 98.5°F | Ht 68.0 in | Wt 207.0 lb

## 2022-02-12 DIAGNOSIS — R634 Abnormal weight loss: Secondary | ICD-10-CM

## 2022-02-12 MED ORDER — WEGOVY 0.5 MG/0.5ML ~~LOC~~ SOAJ: 0.5000 mg | SUBCUTANEOUS | 3 refills | Status: DC

## 2022-02-12 NOTE — Progress Notes (Signed)
? ? ? ?Acute Office Visit ? ?Subjective:  ? ? Patient ID: Dawn Acevedo, female    DOB: 1970-12-12, 51 y.o.   MRN: [id number] ? ?Chief Complaint  ?Patient presents with  ? Weight Check  ? ? ?HPI ?Patient is in today for follow-up of weight loss.  Patient is consistently losing an average of 3 pounds.  Currently on 0.25 mg Wegovy.  Patient is compliant and taking medication as prescribed with no side effects.. ? ?Past Medical History:  ?Diagnosis Date  ? Anemia   ? Arthritis   ? RIGHT knee  ? History of vitamin D deficiency   ? ? ?Past Surgical History:  ?Procedure Laterality Date  ? DILITATION & CURRETTAGE/HYSTROSCOPY WITH NOVASURE ABLATION N/A 10/11/2014  ? Procedure: DILATATION & CURETTAGE/HYSTEROSCOPY WITH NOVASURE ABLATION;  Surgeon: Marvene Staff, MD;  Location: [facility];  Service: Gynecology;  Laterality: N/A;  ? LAPAROSCOPIC GASTRIC SLEEVE RESECTION    ? gastric sleeve  ? LAPAROSCOPIC TUBAL LIGATION Bilateral 10/11/2014  ? Procedure: LAPAROSCOPIC BILATERAL TUBAL LIGATION with Bipolar Cautery;  Surgeon: Marvene Staff, MD;  Location: [facility];  Service: Gynecology;  Laterality: Bilateral;  ? WISDOM TOOTH EXTRACTION    ? ? ?Family History  ?Problem Relation Age of Onset  ? Heart disease Mother   ? Heart attack Mother   ? Hypertension Mother   ? Heart attack Father   ? Diabetes Father   ? Hypertension Father   ? Prostate cancer Father   ? Ovarian cancer Maternal Grandmother   ? Colon cancer Neg Hx   ? Stomach cancer Neg Hx   ? Esophageal cancer Neg Hx   ? Liver disease Neg Hx   ? Pancreatic cancer Neg Hx   ? Colon polyps Neg Hx   ? Rectal cancer Neg Hx   ? ? ?Social History  ? ?Socioeconomic History  ? Marital status: Single  ?  Spouse name: Not on file  ? Number of children: 2  ? Years of education: Not on file  ? Highest education level: Some college, no degree  ?Occupational History  ? Not on file  ?Tobacco Use  ? Smoking status: Never  ? Smokeless tobacco: Never  ?Vaping Use  ? Vaping Use: Never  used  ?Substance and Sexual Activity  ? Alcohol use: No  ?  Comment: 1-2 glasses of wine per month  ? Drug use: No  ? Sexual activity: Not Currently  ?  Birth control/protection: None  ?Other Topics Concern  ? Not on file  ?Social History Narrative  ? Not on file  ? ?Social Determinants of Health  ? ?Financial Resource Strain: Not on file  ?Food Insecurity: Not on file  ?Transportation Needs: Not on file  ?Physical Activity: Not on file  ?Stress: Not on file  ?Social Connections: Not on file  ?Intimate Partner Violence: Not on file  ? ? ?Outpatient Medications Prior to Visit  ?Medication Sig Dispense Refill  ? B Complex Vitamins (VITAMIN B COMPLEX PO) vitamin B complex    ? ibuprofen (ADVIL) 600 MG tablet Take 1 tablet (600 mg total) by mouth every 8 (eight) hours as needed. 30 tablet 0  ? Semaglutide-Weight Management (WEGOVY) 0.25 MG/0.5ML SOAJ Inject 0.25 mg into the skin once a week. Once weekly for 4 weeks 2 mL 3  ? ?No facility-administered medications prior to visit.  ? ? ?Allergies  ?Allergen Reactions  ? Folic Acid Hives  ? ? ?Review of Systems  ?Constitutional: Negative.   ?HENT:  Negative.    ?Eyes: Negative.   ?Respiratory: Negative.    ?Cardiovascular: Negative.   ?Gastrointestinal: Negative.   ?Endocrine: Negative.   ?Genitourinary: Negative.   ?Musculoskeletal: Negative.   ?Skin: Negative.  Negative for rash.  ?All other systems reviewed and are negative. ? ?   ?Objective:  ?  ?Physical Exam ?Vitals and nursing note reviewed.  ?Constitutional:   ?   Appearance: Normal appearance. She is obese.  ?HENT:  ?   Head: Normocephalic.  ?   Right Ear: External ear normal.  ?   Left Ear: External ear normal.  ?   Nose: Nose normal.  ?Eyes:  ?   Conjunctiva/sclera: Conjunctivae normal.  ?Cardiovascular:  ?   Rate and Rhythm: Normal rate and regular rhythm.  ?   Pulses: Normal pulses.  ?   Heart sounds: Normal heart sounds.  ?Pulmonary:  ?   Effort: Pulmonary effort is normal.  ?   Breath sounds: Normal breath  sounds.  ?Abdominal:  ?   General: Bowel sounds are normal.  ?Musculoskeletal:     ?   General: Normal range of motion.  ?Skin: ?   General: Skin is warm.  ?   Findings: No rash.  ?Neurological:  ?   General: No focal deficit present.  ?   Mental Status: She is alert and oriented to person, place, and time.  ?Psychiatric:     ?   Mood and Affect: Mood normal.     ?   Behavior: Behavior normal.  ? ? ?BP 126/82   Pulse (!) 53   Temp 98.5 ?F (36.9 ?C)   Ht _0  (1.727 m)   Wt 207 lb (93.9 kg)   SpO2 98%   BMI 31.47 kg/m?  ?Wt Readings from Last 3 Encounters:  ?02/12/22 207 lb (93.9 kg)  ?12/25/21 217 lb (98.4 kg)  ?08/22/21 214 lb (97.1 kg)  ? ? ?Health Maintenance Due  ?Topic Date Due  ? TETANUS/TDAP  Never done  ? MAMMOGRAM  Never done  ? COVID-19 Vaccine (4 - Booster for Pfizer series) 01/04/2021  ? ? ?There are no preventive care reminders to display for this patient. ? ? ?No results found for: TSH ?Lab Results  ?Component Value Date  ? WBC 6.9 12/25/2021  ? HGB 12.9 12/25/2021  ? HCT 38.7 12/25/2021  ? MCV 96 12/25/2021  ? PLT 263 12/25/2021  ? ?Lab Results  ?Component Value Date  ? NA 142 12/25/2021  ? K 4.5 12/25/2021  ? CO2 23 12/25/2021  ? GLUCOSE 88 12/25/2021  ? BUN 11 12/25/2021  ? CREATININE 0.95 12/25/2021  ? BILITOT 0.3 12/25/2021  ? ALKPHOS 124 (H) 12/25/2021  ? AST 29 12/25/2021  ? ALT 44 (H) 12/25/2021  ? PROT 6.8 12/25/2021  ? ALBUMIN 4.0 12/25/2021  ? CALCIUM 8.6 (L) 12/25/2021  ? EGFR 73 12/25/2021  ? ?Lab Results  ?Component Value Date  ? CHOL 142 12/25/2021  ? ?Lab Results  ?Component Value Date  ? HDL 64 12/25/2021  ? ?Lab Results  ?Component Value Date  ? [place] 66 12/25/2021  ? ?Lab Results  ?Component Value Date  ? TRIG 56 12/25/2021  ? ?Lab Results  ?Component Value Date  ? CHOLHDL 2.2 12/25/2021  ? ?No results found for: HGBA1C ? ?   ?Assessment & Plan:  ? ?Problem List Items Addressed This Visit   ? ?  ? Other  ? Weight loss - Primary  ?  Patient continues to make progress with  Mancel Parsons losing an average of 3 pounds every month.  She is also exercising, and eating a low calorie diet.  Patient will follow-up in 2 months.  Wegovy dose increased to 0.5 mg subq every week for 4 weeks, then 1 mg for 4 weeks starting 03/15/2022.  Education provided to patient printed handouts given.  Rx sent to pharmacy. ?  ?  ? Relevant Medications  ? Semaglutide-Weight Management (WEGOVY) 0.5 MG/0.5ML SOAJ  ? ? ? ?Meds ordered this encounter  ?Medications  ? Semaglutide-Weight Management (WEGOVY) 0.5 MG/0.5ML SOAJ  ?  Sig: Inject 0.5 mg into the skin once a week.  ?  Dispense:  2 mL  ?  Refill:  3  ?  Order Specific Question:   Supervising Provider  ?  AnswerClaretta Fraise [[id number]]  ? ? ? ?Ivy Lynn, NP ? ?

## 2022-02-12 NOTE — Assessment & Plan Note (Addendum)
Patient continues to make progress with Dawn Acevedo losing an average of 3 pounds every month.  She is also exercising, and eating a low calorie diet.  Patient will follow-up in 2 months.  Wegovy dose increased to 0.5 mg subq every week for 4 weeks, then 1 mg for 4 weeks starting 03/15/2022.  Education provided to patient printed handouts given.  Rx sent to pharmacy. ?

## 2022-03-06 ENCOUNTER — Ambulatory Visit: Payer: PRIVATE HEALTH INSURANCE | Admitting: Nurse Practitioner

## 2022-04-05 ENCOUNTER — Encounter: Payer: Self-pay | Admitting: Nurse Practitioner

## 2022-04-05 ENCOUNTER — Ambulatory Visit (INDEPENDENT_AMBULATORY_CARE_PROVIDER_SITE_OTHER): Payer: PRIVATE HEALTH INSURANCE | Admitting: Nurse Practitioner

## 2022-04-05 DIAGNOSIS — R634 Abnormal weight loss: Secondary | ICD-10-CM

## 2022-04-05 NOTE — Patient Instructions (Signed)
Calorie Counting for Weight Loss ?Calories are units of energy. Your body needs a certain number of calories from food to keep going throughout the day. When you eat or drink more calories than your body needs, your body stores the extra calories mostly as fat. When you eat or drink fewer calories than your body needs, your body burns fat to get the energy it needs. ?Calorie counting means keeping track of how many calories you eat and drink each day. Calorie counting can be helpful if you need to lose weight. If you eat fewer calories than your body needs, you should lose weight. Ask your health care provider what a healthy weight is for you. ?For calorie counting to work, you will need to eat the right number of calories each day to lose a healthy amount of weight per week. A dietitian can help you figure out how many calories you need in a day and will suggest ways to reach your calorie goal. ?A healthy amount of weight to lose each week is usually 1-2 lb (0.5-0.9 kg). This usually means that your daily calorie intake should be reduced by 500-750 calories. ?Eating 1,200-1,500 calories a day can help most women lose weight. ?Eating 1,500-1,800 calories a day can help most men lose weight. ?What do I need to know about calorie counting? ?Work with your health care provider or dietitian to determine how many calories you should get each day. To meet your daily calorie goal, you will need to: ?Find out how many calories are in each food that you would like to eat. Try to do this before you eat. ?Decide how much of the food you plan to eat. ?Keep a food log. Do this by writing down what you ate and how many calories it had. ?To successfully lose weight, it is important to balance calorie counting with a healthy lifestyle that includes regular activity. ?Where do I find calorie information? ? ?The number of calories in a food can be found on a Nutrition Facts label. If a food does not have a Nutrition Facts label, try  to look up the calories online or ask your dietitian for help. ?Remember that calories are listed per serving. If you choose to have more than one serving of a food, you will have to multiply the calories per serving by the number of servings you plan to eat. For example, the label on a package of bread might say that a serving size is 1 slice and that there are 90 calories in a serving. If you eat 1 slice, you will have eaten 90 calories. If you eat 2 slices, you will have eaten 180 calories. ?How do I keep a food log? ?After each time that you eat, record the following in your food log as soon as possible: ?What you ate. Be sure to include toppings, sauces, and other extras on the food. ?How much you ate. This can be measured in cups, ounces, or number of items. ?How many calories were in each food and drink. ?The total number of calories in the food you ate. ?Keep your food log near you, such as in a pocket-sized notebook or on an app or website on your mobile phone. Some programs will calculate calories for you and show you how many calories you have left to meet your daily goal. ?What are some portion-control tips? ?Know how many calories are in a serving. This will help you know how many servings you can have of a certain  food. ?Use a measuring cup to measure serving sizes. You could also try weighing out portions on a kitchen scale. With time, you will be able to estimate serving sizes for some foods. ?Take time to put servings of different foods on your favorite plates or in your favorite bowls and cups so you know what a serving looks like. ?Try not to eat straight from a food's packaging, such as from a bag or box. Eating straight from the package makes it hard to see how much you are eating and can lead to overeating. Put the amount you would like to eat in a cup or on a plate to make sure you are eating the right portion. ?Use smaller plates, glasses, and bowls for smaller portions and to prevent  overeating. ?Try not to multitask. For example, avoid watching TV or using your computer while eating. If it is time to eat, sit down at a table and enjoy your food. This will help you recognize when you are full. It will also help you be more mindful of what and how much you are eating. ?What are tips for following this plan? ?Reading food labels ?Check the calorie count compared with the serving size. The serving size may be smaller than what you are used to eating. ?Check the source of the calories. Try to choose foods that are high in protein, fiber, and vitamins, and low in saturated fat, trans fat, and sodium. ?Shopping ?Read nutrition labels while you shop. This will help you make healthy decisions about which foods to buy. ?Pay attention to nutrition labels for low-fat or fat-free foods. These foods sometimes have the same number of calories or more calories than the full-fat versions. They also often have added sugar, starch, or salt to make up for flavor that was removed with the fat. ?Make a grocery list of lower-calorie foods and stick to it. ?Cooking ?Try to cook your favorite foods in a healthier way. For example, try baking instead of frying. ?Use low-fat dairy products. ?Meal planning ?Use more fruits and vegetables. One-half of your plate should be fruits and vegetables. ?Include lean proteins, such as chicken, [place], and fish. ?Lifestyle ?Each week, aim to do one of the following: ?150 minutes of moderate exercise, such as walking. ?75 minutes of vigorous exercise, such as running. ?General information ?Know how many calories are in the foods you eat most often. This will help you calculate calorie counts faster. ?Find a way of tracking calories that works for you. Get creative. Try different apps or programs if writing down calories does not work for you. ?What foods should I eat? ? ?Eat nutritious foods. It is better to have a nutritious, high-calorie food, such as an avocado, than a food with  few nutrients, such as a bag of potato chips. ?Use your calories on foods and drinks that will fill you up and will not leave you hungry soon after eating. ?Examples of foods that fill you up are nuts and nut butters, vegetables, lean proteins, and high-fiber foods such as whole grains. High-fiber foods are foods with more than 5 g of fiber per serving. ?Pay attention to calories in drinks. Low-calorie drinks include water and unsweetened drinks. ?The items listed above may not be a complete list of foods and beverages you can eat. Contact a dietitian for more information. ?What foods should I limit? ?Limit foods or drinks that are not good sources of vitamins, minerals, or protein or that are high in unhealthy fats. These  include: ?Candy. ?Other sweets. ?Sodas, specialty coffee drinks, alcohol, and juice. ?The items listed above may not be a complete list of foods and beverages you should avoid. Contact a dietitian for more information. ?How do I count calories when eating out? ?Pay attention to portions. Often, portions are much larger when eating out. Try these tips to keep portions smaller: ?Consider sharing a meal instead of getting your own. ?If you get your own meal, eat only half of it. Before you start eating, ask for a container and put half of your meal into it. ?When available, consider ordering smaller portions from the menu instead of full portions. ?Pay attention to your food and drink choices. Knowing the way food is cooked and what is included with the meal can help you eat fewer calories. ?If calories are listed on the menu, choose the lower-calorie options. ?Choose dishes that include vegetables, fruits, whole grains, low-fat dairy products, and lean proteins. ?Choose items that are boiled, broiled, grilled, or steamed. Avoid items that are buttered, battered, fried, or served with cream sauce. Items labeled as crispy are usually fried, unless stated otherwise. ?Choose water, low-fat milk,  unsweetened iced tea, or other drinks without added sugar. If you want an alcoholic beverage, choose a lower-calorie option, such as a glass of wine or light beer. ?Ask for dressings, sauces, and syrups on the side.

## 2022-04-05 NOTE — Progress Notes (Signed)
? ?Established Patient Office Visit ? ?Subjective   ?Patient ID: Dawn Acevedo, female    DOB: 08/06/1971  Age: 51 y.o. MRN: [id number] ? ?Chief Complaint  ?Patient presents with  ? Medical Management of Chronic Issues  ?  3 week   ? ? ?HPI ? ?Patient Active Problem List  ? Diagnosis Date Noted  ? Weight loss 02/12/2022  ? 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 Bone spur of foot 07/20/2021  ? Tinea corporis 07/20/2021  ? Recent urinary tract infection 06/08/2021  ? UTI symptoms 06/08/2021  ? Rash 01/09/2021  ? Iron deficiency anemia 01/04/2021  ? Anemia 09/15/2020  ? Annual physical exam 09/15/2020  ? 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 ?Past Medical History:  ?Diagnosis Date  ? Anemia   ? Arthritis   ? RIGHT knee  ? History of vitamin D deficiency   ? ?Past Surgical History:  ?Procedure Laterality Date  ? DILITATION & CURRETTAGE/HYSTROSCOPY WITH NOVASURE ABLATION N/A 10/11/2014  ? Procedure: DILATATION & CURETTAGE/HYSTEROSCOPY WITH NOVASURE ABLATION;  Surgeon: Marvene Staff, MD;  Location: [facility];  Service: Gynecology;  Laterality: N/A;  ? LAPAROSCOPIC GASTRIC SLEEVE RESECTION    ? gastric sleeve  ? LAPAROSCOPIC TUBAL LIGATION Bilateral 10/11/2014  ? Procedure: LAPAROSCOPIC BILATERAL TUBAL LIGATION with Bipolar Cautery;  Surgeon: Marvene Staff, MD;  Location: [facility];  Service: Gynecology;  Laterality: Bilateral;  ? WISDOM TOOTH EXTRACTION    ? ?Social History  ? ?Tobacco Use  ? Smoking status: Never  ? Smokeless tobacco: Never  ?Vaping Use  ? Vaping Use: Never used  ?Substance Use Topics  ? Alcohol use: No  ?  Comment: 1-2 glasses of wine per month  ? Drug use: No  ? ?Social History  ? ?Socioeconomic History  ? Marital status: Single  ?  Spouse name: Not on file  ? Number of children: 2  ? Years of education: Not on file  ? Highest education level: Some college, no degree  ?Occupational History  ? Not on file  ?Tobacco Use  ? Smoking status: Never  ? Smokeless tobacco: Never  ?Vaping Use  ? Vaping  Use: Never used  ?Substance and Sexual Activity  ? Alcohol use: No  ?  Comment: 1-2 glasses of wine per month  ? Drug use: No  ? Sexual activity: Not Currently  ?  Birth control/protection: None  ?Other Topics Concern  ? Not on file  ?Social History Narrative  ? Not on file  ? ?Social Determinants of Health  ? ?Financial Resource Strain: Not on file  ?Food Insecurity: Not on file  ?Transportation Needs: Not on file  ?Physical Activity: Not on file  ?Stress: Not on file  ?Social Connections: Not on file  ?Intimate Partner Violence: Not on file  ? ?Family Status  ?Relation Name Status  ? Mother  Deceased  ? Father  Deceased  ? Sister  Alive  ? Brother  Alive  ? Brother  Alive  ? MGM  (Not Specified)  ? Son  Alive  ? Son  Deceased  ? Neg Hx  (Not Specified)  ? ?Family History  ?Problem Relation Age of Onset  ? Heart disease Mother   ? Heart attack Mother   ? Hypertension Mother   ? Heart attack Father   ? Diabetes Father   ? Hypertension Father   ? Prostate cancer Father   ? Ovarian cancer Maternal Grandmother   ? Colon cancer Neg Hx   ? Stomach cancer Neg Hx   ?  Esophageal cancer Neg Hx   ? Liver disease Neg Hx   ? Pancreatic cancer Neg Hx   ? Colon polyps Neg Hx   ? Rectal cancer Neg Hx   ? ?Allergies  ?Allergen Reactions  ? Folic Acid Hives  ? ?  ? ?Review of Systems  ?Constitutional: Negative.   ?HENT: Negative.    ?Respiratory: Negative.    ?Cardiovascular: Negative.   ?Gastrointestinal: Negative.   ?Genitourinary: Negative.   ?Musculoskeletal: Negative.   ?Skin: Negative.   ?All other systems reviewed and are negative. ? ?  ?Objective:  ?  ? ?BP 117/77   Pulse (!) 50   Temp 97.7 ?F (36.5 ?C)   Resp 20   Ht 5' 8"  (1.727 m)   Wt 200 lb (90.7 kg)   SpO2 98%   BMI 30.41 kg/m?  ?BP Readings from Last 3 Encounters:  ?04/05/22 117/77  ?02/12/22 126/82  ?12/25/21 137/85  ? ?Wt Readings from Last 3 Encounters:  ?04/05/22 200 lb (90.7 kg)  ?02/12/22 207 lb (93.9 kg)  ?12/25/21 217 lb (98.4 kg)  ? ?  ? ?Physical  Exam ?Vitals and nursing note reviewed.  ?Constitutional:   ?   Appearance: She is obese.  ?HENT:  ?   Right Ear: External ear normal.  ?   Left Ear: External ear normal.  ?   Nose: Nose normal.  ?   Mouth/Throat:  ?   Mouth: Mucous membranes are moist.  ?Eyes:  ?   Conjunctiva/sclera: Conjunctivae normal.  ?Cardiovascular:  ?   Rate and Rhythm: Normal rate and regular rhythm.  ?   Pulses: Normal pulses.  ?   Heart sounds: Normal heart sounds.  ?Pulmonary:  ?   Effort: Pulmonary effort is normal.  ?   Breath sounds: Normal breath sounds.  ?Abdominal:  ?   General: Bowel sounds are normal.  ?Skin: ?   General: Skin is warm.  ?   Findings: No rash.  ?Neurological:  ?   Mental Status: She is alert and oriented to person, place, and time.  ? ? ? ?No results found for any visits on 04/05/22. ? ?Last CBC ?Lab Results  ?Component Value Date  ? WBC 6.9 12/25/2021  ? HGB 12.9 12/25/2021  ? HCT 38.7 12/25/2021  ? MCV 96 12/25/2021  ? MCH 32.1 12/25/2021  ? RDW 12.1 12/25/2021  ? PLT 263 12/25/2021  ? ?Last metabolic panel ?Lab Results  ?Component Value Date  ? GLUCOSE 88 12/25/2021  ? NA 142 12/25/2021  ? K 4.5 12/25/2021  ? CL 106 12/25/2021  ? CO2 23 12/25/2021  ? BUN 11 12/25/2021  ? CREATININE 0.95 12/25/2021  ? EGFR 73 12/25/2021  ? CALCIUM 8.6 (L) 12/25/2021  ? PROT 6.8 12/25/2021  ? ALBUMIN 4.0 12/25/2021  ? LABGLOB 2.8 12/25/2021  ? AGRATIO 1.4 12/25/2021  ? BILITOT 0.3 12/25/2021  ? ALKPHOS 124 (H) 12/25/2021  ? AST 29 12/25/2021  ? ALT 44 (H) 12/25/2021  ? ?Last lipids ?Lab Results  ?Component Value Date  ? CHOL 142 12/25/2021  ? HDL 64 12/25/2021  ? [place] 66 12/25/2021  ? TRIG 56 12/25/2021  ? CHOLHDL 2.2 12/25/2021  ? ?Last hemoglobin A1c ?No results found for: HGBA1C ?Last thyroid functions ?No results found for: TSH, T3TOTAL, T4TOTAL, THYROIDAB ?  ? ?The 10-year ASCVD risk score (Arnett DK, et al., 2019) is: 1.8% ? ?  ?Assessment & Plan:  ? ?Problem List Items Addressed This Visit   ? ?  ?  Other  ? Weight loss   ?  Patient is consistently losing weight and has lost a total of 7 pounds from last visit. ?Patient will go up on Wegovy from 0.5 mg weekly to 1 mg weekly.  Follow-up in 3 months. ? ?  ?  ? ? ?Return in about 3 months (around 07/06/2022).  ? ? ?Ivy Lynn, NP ? ?

## 2022-04-05 NOTE — Assessment & Plan Note (Signed)
Patient is consistently losing weight and has lost a total of 7 pounds from last visit. ?Patient will go up on Wegovy from 0.5 mg weekly to 1 mg weekly.  Follow-up in 3 months. ?

## 2022-04-27 DIAGNOSIS — M1711 Unilateral primary osteoarthritis, right knee: Secondary | ICD-10-CM | POA: Insufficient documentation

## 2022-04-27 DIAGNOSIS — S83241A Other tear of medial meniscus, current injury, right knee, initial encounter: Secondary | ICD-10-CM | POA: Insufficient documentation

## 2022-06-25 ENCOUNTER — Other Ambulatory Visit: Payer: Self-pay | Admitting: Nurse Practitioner

## 2022-06-25 DIAGNOSIS — R634 Abnormal weight loss: Secondary | ICD-10-CM

## 2022-07-08 ENCOUNTER — Ambulatory Visit: Payer: PRIVATE HEALTH INSURANCE | Admitting: Nurse Practitioner

## 2022-07-08 ENCOUNTER — Encounter: Payer: Self-pay | Admitting: Nurse Practitioner

## 2022-07-10 ENCOUNTER — Encounter: Payer: Self-pay | Admitting: Nurse Practitioner

## 2022-07-10 ENCOUNTER — Ambulatory Visit (INDEPENDENT_AMBULATORY_CARE_PROVIDER_SITE_OTHER): Payer: PRIVATE HEALTH INSURANCE | Admitting: Nurse Practitioner

## 2022-07-10 VITALS — BP 140/88 | HR 64 | Temp 98.6°F | Ht 68.0 in | Wt 194.2 lb

## 2022-07-10 DIAGNOSIS — R634 Abnormal weight loss: Secondary | ICD-10-CM | POA: Diagnosis not present

## 2022-07-10 MED ORDER — SEMAGLUTIDE-WEIGHT MANAGEMENT 2.4 MG/0.75ML ~~LOC~~ SOAJ: 2.4000 mg | SUBCUTANEOUS | 0 refills | Status: DC

## 2022-07-10 MED ORDER — SEMAGLUTIDE-WEIGHT MANAGEMENT 1.7 MG/0.75ML ~~LOC~~ SOAJ: 1.7000 mg | SUBCUTANEOUS | 0 refills | Status: DC

## 2022-07-10 MED ORDER — SEMAGLUTIDE-WEIGHT MANAGEMENT 1 MG/0.5ML ~~LOC~~ SOAJ
1.0000 mg | SUBCUTANEOUS | 0 refills | Status: DC
Start: 2022-09-06 — End: 2022-11-05

## 2022-07-10 NOTE — Progress Notes (Signed)
Established Patient Office Visit  Subjective   Patient ID: Dawn Acevedo, female    DOB: 1971/01/04  Age: 51 y.o. MRN: [id number]  Chief Complaint  Patient presents with   Weight Loss    HPI Patient is making good progress with weight loss, she has lost a total of 6 lb from her last office visit. Patient is not having any side effect from medication. She is taking medication as prescribed and following [place] as scheduled.    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   Past Medical History:  Diagnosis Date   Anemia    Arthritis    RIGHT knee   History of vitamin D deficiency    Past Surgical History:  Procedure Laterality Date   DILITATION & CURRETTAGE/HYSTROSCOPY WITH NOVASURE ABLATION N/A 10/11/2014   Procedure: DILATATION & CURETTAGE/HYSTEROSCOPY WITH NOVASURE ABLATION;  Surgeon: Marvene Staff, MD;  Location: [facility];  Service: Gynecology;  Laterality: N/A;   LAPAROSCOPIC GASTRIC SLEEVE RESECTION     gastric sleeve   LAPAROSCOPIC TUBAL LIGATION Bilateral 10/11/2014   Procedure: LAPAROSCOPIC BILATERAL TUBAL LIGATION with Bipolar Cautery;  Surgeon: Marvene Staff, MD;  Location: [facility];  Service: Gynecology;  Laterality: Bilateral;   WISDOM TOOTH EXTRACTION     Social History   Tobacco Use   Smoking status: Never   Smokeless tobacco: Never  Vaping Use   Vaping Use: Never used  Substance Use Topics   Alcohol use: No    Comment: 1-2 glasses of wine per month   Drug use: No   Social History   Socioeconomic History   Marital status: Single    Spouse name: Not on file   Number of children: 2   Years of  education: Not on file   Highest education level: Some college, no degree  Occupational History   Not on file  Tobacco Use   Smoking status: Never   Smokeless tobacco: Never  Vaping Use   Vaping Use: Never used  Substance and Sexual Activity   Alcohol use: No    Comment: 1-2 glasses of wine per month   Drug use: No   Sexual activity: Not Currently    Birth control/protection: None  Other Topics Concern   Not on file  Social History Narrative   Not on file   Social Determinants of Health   Financial Resource Strain: Not on file  Food Insecurity: Not on file  Transportation Needs: Not on file  Physical Activity: Not on file  Stress: Not on file  Social Connections: Not on file  Intimate Partner Violence: Not on file   Family Status  Relation Name Status   Mother  Deceased   Father  Deceased   Sister  Alive   Brother  Alive   Brother  Alive   MGM  (Not Specified)   Son  Alive   Son  Deceased   Neg Hx  (Not Specified)   Family History  Problem Relation Age of Onset   Heart disease Mother    Heart attack Mother    Hypertension Mother    Heart attack Father    Diabetes  Father    Hypertension Father    Prostate cancer Father    Ovarian cancer Maternal Grandmother    Colon cancer Neg Hx    Stomach cancer Neg Hx    Esophageal cancer Neg Hx    Liver disease Neg Hx    Pancreatic cancer Neg Hx    Colon polyps Neg Hx    Rectal cancer Neg Hx    Allergies  Allergen Reactions   Folic Acid Hives      Review of Systems  HENT: Negative.    Eyes: Negative.   Respiratory: Negative.    Cardiovascular: Negative.   Genitourinary: Negative.   Musculoskeletal: Negative.   Skin: Negative.  Negative for itching and rash.  Neurological: Negative.   Psychiatric/Behavioral: Negative.    All other systems reviewed and are negative.     Objective:     BP (!) 140/88   Pulse 64   Temp 98.6 F (37 C)   Ht 5' 8"  (1.727 m)   Wt 194 lb 3.2 oz (88.1 kg)   SpO2 99%    BMI 29.53 kg/m  BP Readings from Last 3 Encounters:  07/10/22 (!) 140/88  04/05/22 117/77  02/12/22 126/82   Wt Readings from Last 3 Encounters:  07/10/22 194 lb 3.2 oz (88.1 kg)  04/05/22 200 lb (90.7 kg)  02/12/22 207 lb (93.9 kg)      Physical Exam Vitals and nursing note reviewed.  Constitutional:      Appearance: Normal appearance. She is obese.  HENT:     Head: Normocephalic.     Right Ear: External ear normal.     Left Ear: External ear normal.     Nose: Nose normal.     Mouth/Throat:     Mouth: Mucous membranes are moist.     Pharynx: Oropharynx is clear.  Eyes:     Conjunctiva/sclera: Conjunctivae normal.  Cardiovascular:     Rate and Rhythm: Normal rate and regular rhythm.     Pulses: Normal pulses.     Heart sounds: Normal heart sounds.  Pulmonary:     Effort: Pulmonary effort is normal.     Breath sounds: Normal breath sounds.  Abdominal:     General: Bowel sounds are normal.  Skin:    General: Skin is warm.     Findings: No erythema or rash.  Neurological:     General: No focal deficit present.     Mental Status: She is alert and oriented to person, place, and time.      No results found for any visits on 07/10/22.  Last CBC Lab Results  Component Value Date   WBC 6.9 12/25/2021   HGB 12.9 12/25/2021   HCT 38.7 12/25/2021   MCV 96 12/25/2021   MCH 32.1 12/25/2021   RDW 12.1 12/25/2021   PLT 263 09/32/3557   Last metabolic panel Lab Results  Component Value Date   GLUCOSE 88 12/25/2021   NA 142 12/25/2021   K 4.5 12/25/2021   CL 106 12/25/2021   CO2 23 12/25/2021   BUN 11 12/25/2021   CREATININE 0.95 12/25/2021   EGFR 73 12/25/2021   CALCIUM 8.6 (L) 12/25/2021   PROT 6.8 12/25/2021   ALBUMIN 4.0 12/25/2021   LABGLOB 2.8 12/25/2021   AGRATIO 1.4 12/25/2021   BILITOT 0.3 12/25/2021   ALKPHOS 124 (H) 12/25/2021   AST 29 12/25/2021   ALT 44 (H) 12/25/2021   Last lipids Lab Results  Component Value Date   CHOL 142 12/25/2021  HDL 64 12/25/2021   LDLCALC 66 12/25/2021   TRIG 56 12/25/2021   CHOLHDL 2.2 12/25/2021   Last hemoglobin A1c No results found for: "HGBA1C"    The 10-year ASCVD risk score (Arnett DK, et al., 2019) is: 2%    Assessment & Plan:  Patient is loosing weight as expected, education provided on continued exercise, calorie counting. Follow up in 3 months. Rx refill sent to pharmacy. Patient knows to call back if her pharmacy has not restocked and medication will be sent to another pharmacy of her choice.  Problem List Items Addressed This Visit       Other   Weight loss - Primary   Relevant Medications   Semaglutide-Weight Management 1 MG/0.5ML SOAJ (Start on 09/06/2022)   Semaglutide-Weight Management 1.7 MG/0.75ML SOAJ (Start on 10/05/2022)   Semaglutide-Weight Management 2.4 MG/0.75ML SOAJ (Start on 11/03/2022)    No follow-ups on file.    Ivy Lynn, NP

## 2022-07-11 ENCOUNTER — Telehealth: Payer: Self-pay | Admitting: *Deleted

## 2022-07-11 NOTE — Telephone Encounter (Signed)
  Please call patient and pharmacy approved

## 2022-07-11 NOTE — Telephone Encounter (Signed)
Dawn Acevedo (Key: [facility]) Rx #: [id number] [phone] '1MG'$ /0.5ML auto-injectors  Sent to plan

## 2022-07-11 NOTE — Telephone Encounter (Signed)
  Resent wegovy PA under 1.'7mg'$  dose Documents attached Please f/u

## 2022-07-12 NOTE — Telephone Encounter (Signed)
Left vm informing pt

## 2022-07-12 NOTE — Telephone Encounter (Signed)
Looks like its been approved

## 2022-07-15 ENCOUNTER — Ambulatory Visit: Payer: PRIVATE HEALTH INSURANCE | Admitting: Nurse Practitioner

## 2022-08-02 ENCOUNTER — Other Ambulatory Visit: Payer: Self-pay | Admitting: Nurse Practitioner

## 2022-08-02 DIAGNOSIS — R634 Abnormal weight loss: Secondary | ICD-10-CM

## 2022-10-22 ENCOUNTER — Other Ambulatory Visit: Payer: Self-pay | Admitting: Nurse Practitioner

## 2022-10-22 DIAGNOSIS — R634 Abnormal weight loss: Secondary | ICD-10-CM

## 2022-11-05 ENCOUNTER — Ambulatory Visit (INDEPENDENT_AMBULATORY_CARE_PROVIDER_SITE_OTHER): Payer: PRIVATE HEALTH INSURANCE | Admitting: Nurse Practitioner

## 2022-11-05 ENCOUNTER — Encounter: Payer: Self-pay | Admitting: Nurse Practitioner

## 2022-11-05 VITALS — BP 103/80 | HR 56 | Temp 97.8°F | Ht 68.0 in | Wt 177.8 lb

## 2022-11-05 DIAGNOSIS — R634 Abnormal weight loss: Secondary | ICD-10-CM

## 2022-11-05 NOTE — Progress Notes (Signed)
Established Patient Office Visit  Subjective   Patient ID: Dawn Acevedo, female    DOB: [birth year]  Age: 51 y.o. MRN: [id number]  Chief Complaint  Patient presents with   Medication Refill    HPI  Patient is following up for weight loss, she is compliant with medication, patient is not experiencing any side effects, and following up as recommended.   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   Past Medical History:  Diagnosis Date   Anemia    Arthritis    RIGHT knee   History of vitamin D deficiency    Past Surgical History:  Procedure Laterality Date   DILITATION & CURRETTAGE/HYSTROSCOPY WITH NOVASURE ABLATION N/A 10/11/2014   Procedure: DILATATION & CURETTAGE/HYSTEROSCOPY WITH NOVASURE ABLATION;  Surgeon: Marvene Staff, MD;  Location: [facility];  Service: Gynecology;  Laterality: N/A;   LAPAROSCOPIC GASTRIC SLEEVE RESECTION     gastric sleeve   LAPAROSCOPIC TUBAL LIGATION Bilateral 10/11/2014   Procedure: LAPAROSCOPIC BILATERAL TUBAL LIGATION with Bipolar Cautery;  Surgeon: Marvene Staff, MD;  Location: [facility];  Service: Gynecology;  Laterality: Bilateral;   WISDOM TOOTH EXTRACTION     Social History   Tobacco Use   Smoking status: Never   Smokeless tobacco: Never  Vaping Use   Vaping Use: Never used  Substance Use Topics   Alcohol use: No    Comment: 1-2 glasses of wine per month   Drug use: No   Social History   Socioeconomic History   Marital status: Single    Spouse name: Not on file   Number of children: 2   Years of education: Not on file   Highest education level: Some college, no degree   Occupational History   Not on file  Tobacco Use   Smoking status: Never   Smokeless tobacco: Never  Vaping Use   Vaping Use: Never used  Substance and Sexual Activity   Alcohol use: No    Comment: 1-2 glasses of wine per month   Drug use: No   Sexual activity: Not Currently    Birth control/protection: None  Other Topics Concern   Not on file  Social History Narrative   Not on file   Social Determinants of Health   Financial Resource Strain: Not on file  Food Insecurity: Not on file  Transportation Needs: Not on file  Physical Activity: Not on file  Stress: Not on file  Social Connections: Not on file  Intimate Partner Violence: Not on file   Family Status  Relation Name Status   Mother  Deceased   Father  Deceased   Sister  Alive   Brother  Alive   Brother  Alive   MGM  (Not Specified)   Son  Alive   Son  Deceased   Neg Hx  (Not Specified)   Family History  Problem Relation Age of Onset   Heart disease Mother    Heart attack Mother    Hypertension Mother    Heart attack Father    Diabetes Father    Hypertension Father    Prostate cancer Father    Ovarian cancer  Maternal Grandmother    Colon cancer Neg Hx    Stomach cancer Neg Hx    Esophageal cancer Neg Hx    Liver disease Neg Hx    Pancreatic cancer Neg Hx    Colon polyps Neg Hx    Rectal cancer Neg Hx       Review of Systems  Constitutional: Negative.  Negative for chills and fever.  HENT: Negative.    Eyes: Negative.   Respiratory: Negative.    Cardiovascular: Negative.   Genitourinary: Negative.   Musculoskeletal: Negative.   Skin: Negative.   Neurological: Negative.   All other systems reviewed and are negative.     Objective:     BP 103/80   Pulse (!) 56   Temp 97.8 F (36.6 C)   Ht _0  (1.727 m)   Wt 177 lb 12.8 oz (80.6 kg)   SpO2 99%   BMI 27.03 kg/m  BP Readings from Last 3 Encounters:  11/05/22 103/80  07/10/22 (!) 140/88  04/05/22 117/77   Wt Readings from  Last 3 Encounters:  11/05/22 177 lb 12.8 oz (80.6 kg)  07/10/22 194 lb 3.2 oz (88.1 kg)  04/05/22 200 lb (90.7 kg)      Physical Exam Vitals and nursing note reviewed.  Constitutional:      Appearance: Normal appearance.  HENT:     Head: Normocephalic.     Right Ear: External ear normal.     Left Ear: External ear normal.     Nose: Nose normal.     Mouth/Throat:     Mouth: Mucous membranes are moist.     Pharynx: Oropharynx is clear.  Eyes:     Conjunctiva/sclera: Conjunctivae normal.  Cardiovascular:     Rate and Rhythm: Normal rate and regular rhythm.     Pulses: Normal pulses.     Heart sounds: Normal heart sounds.  Pulmonary:     Effort: Pulmonary effort is normal.     Breath sounds: Normal breath sounds.  Abdominal:     General: Bowel sounds are normal.  Skin:    General: Skin is warm.     Findings: No erythema or rash.  Neurological:     General: No focal deficit present.     Mental Status: She is alert and oriented to person, place, and time.  Psychiatric:        Mood and Affect: Mood normal.        Behavior: Behavior normal.      No results found for any visits on 11/05/22.  Last CBC Lab Results  Component Value Date   WBC 6.9 12/25/2021   HGB 12.9 12/25/2021   HCT 38.7 12/25/2021   MCV 96 12/25/2021   MCH 32.1 12/25/2021   RDW 12.1 12/25/2021   PLT 263 26/41/5830   Last metabolic panel Lab Results  Component Value Date   GLUCOSE 88 12/25/2021   NA 142 12/25/2021   K 4.5 12/25/2021   CL 106 12/25/2021   CO2 23 12/25/2021   BUN 11 12/25/2021   CREATININE 0.95 12/25/2021   EGFR 73 12/25/2021   CALCIUM 8.6 (L) 12/25/2021   PROT 6.8 12/25/2021   ALBUMIN 4.0 12/25/2021   LABGLOB 2.8 12/25/2021   AGRATIO 1.4 12/25/2021   BILITOT 0.3 12/25/2021   ALKPHOS 124 (H) 12/25/2021   AST 29 12/25/2021   ALT 44 (H) 12/25/2021   Last lipids Lab Results  Component Value Date   CHOL 142 12/25/2021   HDL 64 12/25/2021  LDLCALC 66 12/25/2021    TRIG 56 12/25/2021   CHOLHDL 2.2 12/25/2021   Last hemoglobin A1c No results found for: "HGBA1C" Last thyroid functions No results found for: "TSH", "T3TOTAL", "T4TOTAL", "THYROIDAB" Last vitamin D Lab Results  Component Value Date   VD25OH 13 (L) 07/14/2014   Last vitamin B12 and Folate Lab Results  Component Value Date   VITAMINB12 123 (L) 01/04/2021   FOLATE 4.5 (L) 01/04/2021      The 10-year ASCVD risk score (Arnett DK, et al., 2019) is: 0.6%    Assessment & Plan:   Problem List Items Addressed This Visit       Other   Weight loss - Primary    Patient is consistently losing weight and currently weigh 177lb, she is currently on 2.4 mg of GLP1, will continue on maintenance dose, follow up in - 4 months. Labs completed CBC, CMP, LIPID PANEL and A1c, results pending.       Relevant Orders   CBC with Differential   CMP14+EGFR   Lipid Panel   Bayer DCA Hb A1c Waived    Return in about 4 months (around 03/07/2023).    Ivy Lynn, NP

## 2022-11-05 NOTE — Assessment & Plan Note (Addendum)
Patient is consistently losing weight and currently weigh 177lb, she is currently on 2.4 mg of GLP1, will continue on maintenance dose, follow up in - 4 months. Labs completed CBC, CMP, LIPID PANEL and A1c, results pending.

## 2022-11-05 NOTE — Patient Instructions (Signed)
Calorie Counting for Weight Loss Calories are units of energy. Your body needs a certain number of calories from food to keep going throughout the day. When you eat or drink more calories than your body needs, your body stores the extra calories mostly as fat. When you eat or drink fewer calories than your body needs, your body burns fat to get the energy it needs. Calorie counting means keeping track of how many calories you eat and drink each day. Calorie counting can be helpful if you need to lose weight. If you eat fewer calories than your body needs, you should lose weight. Ask your health care provider what a healthy weight is for you. For calorie counting to work, you will need to eat the right number of calories each day to lose a healthy amount of weight per week. A dietitian can help you figure out how many calories you need in a day and will suggest ways to reach your calorie goal. A healthy amount of weight to lose each week is usually 1-2 lb (0.5-0.9 kg). This usually means that your daily calorie intake should be reduced by 500-750 calories. Eating 1,200-1,500 calories a day can help most women lose weight. Eating 1,500-1,800 calories a day can help most men lose weight. What do I need to know about calorie counting? Work with your health care provider or dietitian to determine how many calories you should get each day. To meet your daily calorie goal, you will need to: Find out how many calories are in each food that you would like to eat. Try to do this before you eat. Decide how much of the food you plan to eat. Keep a food log. Do this by writing down what you ate and how many calories it had. To successfully lose weight, it is important to balance calorie counting with a healthy lifestyle that includes regular activity. Where do I find calorie information?  The number of calories in a food can be found on a Nutrition Facts label. If a food does not have a Nutrition Facts label, try  to look up the calories online or ask your dietitian for help. Remember that calories are listed per serving. If you choose to have more than one serving of a food, you will have to multiply the calories per serving by the number of servings you plan to eat. For example, the label on a package of bread might say that a serving size is 1 slice and that there are 90 calories in a serving. If you eat 1 slice, you will have eaten 90 calories. If you eat 2 slices, you will have eaten 180 calories. How do I keep a food log? After each time that you eat, record the following in your food log as soon as possible: What you ate. Be sure to include toppings, sauces, and other extras on the food. How much you ate. This can be measured in cups, ounces, or number of items. How many calories were in each food and drink. The total number of calories in the food you ate. Keep your food log near you, such as in a pocket-sized notebook or on an app or website on your mobile phone. Some programs will calculate calories for you and show you how many calories you have left to meet your daily goal. What are some portion-control tips? Know how many calories are in a serving. This will help you know how many servings you can have of a certain   food. Use a measuring cup to measure serving sizes. You could also try weighing out portions on a kitchen scale. With time, you will be able to estimate serving sizes for some foods. Take time to put servings of different foods on your favorite plates or in your favorite bowls and cups so you know what a serving looks like. Try not to eat straight from a food's packaging, such as from a bag or box. Eating straight from the package makes it hard to see how much you are eating and can lead to overeating. Put the amount you would like to eat in a cup or on a plate to make sure you are eating the right portion. Use smaller plates, glasses, and bowls for smaller portions and to prevent  overeating. Try not to multitask. For example, avoid watching TV or using your computer while eating. If it is time to eat, sit down at a table and enjoy your food. This will help you recognize when you are full. It will also help you be more mindful of what and how much you are eating. What are tips for following this plan? Reading food labels Check the calorie count compared with the serving size. The serving size may be smaller than what you are used to eating. Check the source of the calories. Try to choose foods that are high in protein, fiber, and vitamins, and low in saturated fat, trans fat, and sodium. Shopping Read nutrition labels while you shop. This will help you make healthy decisions about which foods to buy. Pay attention to nutrition labels for low-fat or fat-free foods. These foods sometimes have the same number of calories or more calories than the full-fat versions. They also often have added sugar, starch, or salt to make up for flavor that was removed with the fat. Make a grocery list of lower-calorie foods and stick to it. Cooking Try to cook your favorite foods in a healthier way. For example, try baking instead of frying. Use low-fat dairy products. Meal planning Use more fruits and vegetables. One-half of your plate should be fruits and vegetables. Include lean proteins, such as chicken, turkey, and fish. Lifestyle Each week, aim to do one of the following: 150 minutes of moderate exercise, such as walking. 75 minutes of vigorous exercise, such as running. General information Know how many calories are in the foods you eat most often. This will help you calculate calorie counts faster. Find a way of tracking calories that works for you. Get creative. Try different apps or programs if writing down calories does not work for you. What foods should I eat?  Eat nutritious foods. It is better to have a nutritious, high-calorie food, such as an avocado, than a food with  few nutrients, such as a bag of potato chips. Use your calories on foods and drinks that will fill you up and will not leave you hungry soon after eating. Examples of foods that fill you up are nuts and nut butters, vegetables, lean proteins, and high-fiber foods such as whole grains. High-fiber foods are foods with more than 5 g of fiber per serving. Pay attention to calories in drinks. Low-calorie drinks include water and unsweetened drinks. The items listed above may not be a complete list of foods and beverages you can eat. Contact a dietitian for more information. What foods should I limit? Limit foods or drinks that are not good sources of vitamins, minerals, or protein or that are high in unhealthy fats. These   include: Candy. Other sweets. Sodas, specialty coffee drinks, alcohol, and juice. The items listed above may not be a complete list of foods and beverages you should avoid. Contact a dietitian for more information. How do I count calories when eating out? Pay attention to portions. Often, portions are much larger when eating out. Try these tips to keep portions smaller: Consider sharing a meal instead of getting your own. If you get your own meal, eat only half of it. Before you start eating, ask for a container and put half of your meal into it. When available, consider ordering smaller portions from the menu instead of full portions. Pay attention to your food and drink choices. Knowing the way food is cooked and what is included with the meal can help you eat fewer calories. If calories are listed on the menu, choose the lower-calorie options. Choose dishes that include vegetables, fruits, whole grains, low-fat dairy products, and lean proteins. Choose items that are boiled, broiled, grilled, or steamed. Avoid items that are buttered, battered, fried, or served with cream sauce. Items labeled as crispy are usually fried, unless stated otherwise. Choose water, low-fat milk,  unsweetened iced tea, or other drinks without added sugar. If you want an alcoholic beverage, choose a lower-calorie option, such as a glass of wine or light beer. Ask for dressings, sauces, and syrups on the side. These are usually high in calories, so you should limit the amount you eat. If you want a salad, choose a garden salad and ask for grilled meats. Avoid extra toppings such as bacon, cheese, or fried items. Ask for the dressing on the side, or ask for olive oil and vinegar or lemon to use as dressing. Estimate how many servings of a food you are given. Knowing serving sizes will help you be aware of how much food you are eating at restaurants. Where to find more information Centers for Disease Control and Prevention: www.cdc.gov [place]. Department of Agriculture: myplate.gov Summary Calorie counting means keeping track of how many calories you eat and drink each day. If you eat fewer calories than your body needs, you should lose weight. A healthy amount of weight to lose per week is usually 1-2 lb (0.5-0.9 kg). This usually means reducing your daily calorie intake by 500-750 calories. The number of calories in a food can be found on a Nutrition Facts label. If a food does not have a Nutrition Facts label, try to look up the calories online or ask your dietitian for help. Use smaller plates, glasses, and bowls for smaller portions and to prevent overeating. Use your calories on foods and drinks that will fill you up and not leave you hungry shortly after a meal. This information is not intended to replace advice given to you by your health care provider. Make sure you discuss any questions you have with your health care provider. Document Revised: 12/23/2019 Document Reviewed: 12/23/2019 Elsevier Patient Education  2023 [employer]  

## 2022-11-06 LAB — CMP14+EGFR
ALT: 87 IU/L — ABNORMAL HIGH (ref 0–32)
AST: 54 IU/L — ABNORMAL HIGH (ref 0–40)
Albumin/Globulin Ratio: 2 (ref 1.2–2.2)
Albumin: 4.3 g/dL (ref 3.8–4.9)
Alkaline Phosphatase: 102 IU/L (ref 44–121)
BUN/Creatinine Ratio: 10 (ref 9–23)
BUN: 8 mg/dL (ref 6–24)
Bilirubin Total: 0.6 mg/dL (ref 0.0–1.2)
CO2: 25 mmol/L (ref 20–29)
Calcium: 9.1 mg/dL (ref 8.7–10.2)
Chloride: 106 mmol/L (ref 96–106)
Creatinine, Ser: 0.8 mg/dL (ref 0.57–1.00)
Globulin, Total: 2.1 g/dL (ref 1.5–4.5)
Glucose: 73 mg/dL (ref 70–99)
Potassium: 3.6 mmol/L (ref 3.5–5.2)
Sodium: 141 mmol/L (ref 134–144)
Total Protein: 6.4 g/dL (ref 6.0–8.5)
eGFR: 89 mL/min/{1.73_m2} (ref >59)

## 2022-11-06 LAB — BAYER DCA HB A1C WAIVED: HB A1C (BAYER DCA - WAIVED): 5.4 % (ref 4.8–5.6)

## 2022-11-06 LAB — CBC WITH DIFFERENTIAL/PLATELET
Basophils Absolute: 0 10*3/uL (ref 0.0–0.2)
Basos: 1 %
EOS (ABSOLUTE): 0.1 10*3/uL (ref 0.0–0.4)
Eos: 3 %
Hematocrit: 36.8 % (ref 34.0–46.6)
Hemoglobin: 12.7 g/dL (ref 11.1–15.9)
Immature Grans (Abs): 0 10*3/uL (ref 0.0–0.1)
Immature Granulocytes: 0 %
Lymphocytes Absolute: 2.2 10*3/uL (ref 0.7–3.1)
Lymphs: 39 %
MCH: 31.4 pg (ref 26.6–33.0)
MCHC: 34.5 g/dL (ref 31.5–35.7)
MCV: 91 fL (ref 79–97)
Monocytes Absolute: 0.4 10*3/uL (ref 0.1–0.9)
Monocytes: 7 %
Neutrophils Absolute: 2.8 10*3/uL (ref 1.4–7.0)
Neutrophils: 50 %
Platelets: 209 10*3/uL (ref 150–450)
RBC: 4.04 x10E6/uL (ref 3.77–5.28)
RDW: 11.5 % — ABNORMAL LOW (ref 11.7–15.4)
WBC: 5.6 10*3/uL (ref 3.4–10.8)

## 2022-11-06 LAB — LIPID PANEL
Chol/HDL Ratio: 1.9 ratio (ref 0.0–4.4)
Cholesterol, Total: 130 mg/dL (ref 100–199)
HDL: 70 mg/dL (ref >39)
LDL Chol Calc (NIH): 49 mg/dL (ref 0–99)
Triglycerides: 43 mg/dL (ref 0–149)
VLDL Cholesterol Cal: 11 mg/dL (ref 5–40)

## 2022-11-26 ENCOUNTER — Telehealth: Payer: Self-pay | Admitting: Nurse Practitioner

## 2022-11-26 ENCOUNTER — Other Ambulatory Visit: Payer: Self-pay | Admitting: Nurse Practitioner

## 2022-11-26 DIAGNOSIS — R748 Abnormal levels of other serum enzymes: Secondary | ICD-10-CM

## 2022-11-26 NOTE — Telephone Encounter (Signed)
Pt returned missed call. Please call back when available.

## 2022-11-26 NOTE — Telephone Encounter (Signed)
Reviewed provider lab result notes with patient. Patient voiced understanding but wants to know what could be causing her liver enzyme to be up when she doesn't tak NSAID drugs or drink alcohol.  Please advise and call patient.

## 2022-11-26 NOTE — Telephone Encounter (Signed)
So many factors can affect the liver, Hep C, tylenol, cholesterol medications etc. I put in a liver function test for you, please come by the clinic and have that completed. And let see what we find. Thanks

## 2022-11-26 NOTE — Telephone Encounter (Signed)
Attempted to call pt back rung 3 times - left vm for cb

## 2022-12-03 ENCOUNTER — Telehealth: Payer: Self-pay

## 2022-12-03 NOTE — Telephone Encounter (Signed)
Called pt no answer left vm 01/02 called pt 01/09 no answer left 2nd vm

## 2022-12-05 ENCOUNTER — Telehealth: Payer: Self-pay

## 2022-12-05 NOTE — Telephone Encounter (Signed)
Attempted to call pt , left vm for cb  

## 2022-12-20 ENCOUNTER — Encounter: Payer: Self-pay | Admitting: *Deleted

## 2023-02-17 ENCOUNTER — Encounter: Payer: Self-pay | Admitting: Nurse Practitioner

## 2023-03-04 ENCOUNTER — Other Ambulatory Visit: Payer: Self-pay | Admitting: Family Medicine

## 2023-03-04 ENCOUNTER — Ambulatory Visit: Payer: PRIVATE HEALTH INSURANCE | Admitting: Nurse Practitioner

## 2023-03-04 ENCOUNTER — Ambulatory Visit (INDEPENDENT_AMBULATORY_CARE_PROVIDER_SITE_OTHER): Payer: PRIVATE HEALTH INSURANCE | Admitting: Family Medicine

## 2023-03-04 ENCOUNTER — Encounter: Payer: Self-pay | Admitting: Family Medicine

## 2023-03-04 VITALS — BP 128/66 | HR 58 | Temp 98.5°F | Ht 68.0 in | Wt 193.0 lb

## 2023-03-04 DIAGNOSIS — D509 Iron deficiency anemia, unspecified: Secondary | ICD-10-CM | POA: Diagnosis not present

## 2023-03-04 DIAGNOSIS — R7989 Other specified abnormal findings of blood chemistry: Secondary | ICD-10-CM

## 2023-03-04 DIAGNOSIS — R112 Nausea with vomiting, unspecified: Secondary | ICD-10-CM | POA: Diagnosis not present

## 2023-03-04 DIAGNOSIS — Z903 Acquired absence of stomach [part of]: Secondary | ICD-10-CM | POA: Diagnosis not present

## 2023-03-04 NOTE — Progress Notes (Signed)
Acute Office Visit  Subjective:  Patient ID: Dawn Acevedo, female    DOB: 12-Sep-1971, 52 y.o.   MRN: [id number]  Chief Complaint  Patient presents with   Medical Management of Chronic Issues    Weight mgmt Stopped injections, concern for heart    HPI Patient is in today for follow up of chronic conditions   Obesity Was previously on semaglutide. She recently stopped taking it in January after she had an episode where she was not feeling well and received blood work and her liver enzymes were elevated. States she does not drink ETOH and only takes medication if it is prescribed. She tries to avoid medication. Continues to walk stairs at work ([employer]) Has increased water intake. States that she is trying to manage her portion-control.  Her insurance covers semaglutide. Had weight loss surgery in 2012 and would like referral back to them to be evaluated for revision.   States that since Sunday she has been sick on her stomach at the smell/sight of meat including fish.  States that she was outside during her [employer], but other than that is rarely outside. Does not know of any tick bites. States she was able to eat a potato with bacon on it today.  Endorses increased bowel movements as well. Denies fever.   IDA  Denies palpitation, dizziness. Is not currently on treatment.   ROS As per HPI   Objective:  BP 128/66   Pulse (!) 58   Temp 98.5 F (36.9 C)   Ht 5\' 8"  (1.727 m)   Wt 193 lb (87.5 kg)   LMP  (Exact Date)   SpO2 98%   BMI 29.35 kg/m    Physical Exam Constitutional:      General: She is not in acute distress.    Appearance: Normal appearance. She is well-developed and overweight. She is not ill-appearing, toxic-appearing or diaphoretic.     Interventions: She is not intubated. Cardiovascular:     Rate and Rhythm: Regular rhythm. Bradycardia present.     Pulses: Normal pulses.     Heart sounds: Normal heart sounds. No murmur heard.    No gallop.   Pulmonary:     Effort: Pulmonary effort is normal. No tachypnea, bradypnea, accessory muscle usage, prolonged expiration, respiratory distress or retractions. She is not intubated.     Breath sounds: Normal breath sounds. No stridor, decreased air movement or transmitted upper airway sounds. No decreased breath sounds, wheezing, rhonchi or rales.  Abdominal:     General: Abdomen is flat. Bowel sounds are normal. There is no distension.     Palpations: Abdomen is soft. There is no mass.     Tenderness: There is no abdominal tenderness. There is no guarding or rebound.     Hernia: No hernia is present.  Musculoskeletal:     Right lower leg: No edema.     Left lower leg: No edema.  Skin:    General: Skin is warm.     Capillary Refill: Capillary refill takes less than 2 seconds.  Neurological:     General: No focal deficit present.     Mental Status: She is alert and oriented to person, place, and time. Mental status is at baseline.     Motor: No weakness.  Psychiatric:        Mood and Affect: Mood normal.        Behavior: Behavior normal. Behavior is cooperative.        Thought Content:  Thought content normal.        Judgment: Judgment normal.     Assessment & Plan:  1. 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 States that she would like referral to reestablish with [facility] to be assessed for a revision. Patient declines referral to nutrition at this time. States that she previously saw one at [facility]. Provided patient handout for exercise and discussed maintaining health diet for weight loss.  - Amb Referral to Bariatric Surgery  2. Iron deficiency anemia, unspecified iron deficiency anemia type Not currently on medication. Last labs were normal. Will monitor with labs as below.  - Anemia Profile B  3. Elevated LFTs Previously elevated LFTs. Has not taken semaglutide - CMP14+EGFR  4. Nausea and vomiting, unspecified vomiting  type New aversion to meat including fish since Sunday. Discussed with patient that this is likely not due to tick born disease given that she does not know of any bites and that it includes fish. In addition symptom is inconsistent. Instructed patient to keep a food diary and to follow up in 2 weeks with CPE for [place] to review. Instructed her to remain hydrated and to follow up sooner if symptoms worsen or persist.    The above assessment and management plan was discussed with the patient. The patient verbalized understanding of and has agreed to the management plan using shared-decision making. Patient is aware to call the clinic if they develop any new symptoms or if symptoms fail to improve or worsen. Patient is aware when to return to the clinic for a follow-up visit. Patient educated on when it is appropriate to go to the emergency department.   Neale Burly, DNP-FNP [facility] [address] [place], [place] [address] [phone]

## 2023-03-05 ENCOUNTER — Telehealth: Payer: Self-pay | Admitting: Nurse Practitioner

## 2023-03-06 LAB — ANEMIA PROFILE B
Basophils Absolute: 0 10*3/uL (ref 0.0–0.2)
Basos: 1 %
EOS (ABSOLUTE): 0.3 10*3/uL (ref 0.0–0.4)
Eos: 4 %
Ferritin: 205 ng/mL — ABNORMAL HIGH (ref 15–150)
Folate: 2.5 ng/mL — ABNORMAL LOW (ref >3.0)
Hematocrit: 39.4 % (ref 34.0–46.6)
Hemoglobin: 13 g/dL (ref 11.1–15.9)
Immature Grans (Abs): 0 10*3/uL (ref 0.0–0.1)
Immature Granulocytes: 0 %
Iron Saturation: 26 % (ref 15–55)
Iron: 72 ug/dL (ref 27–159)
Lymphocytes Absolute: 2.8 10*3/uL (ref 0.7–3.1)
Lymphs: 43 %
MCH: 31 pg (ref 26.6–33.0)
MCHC: 33 g/dL (ref 31.5–35.7)
MCV: 94 fL (ref 79–97)
Monocytes Absolute: 0.5 10*3/uL (ref 0.1–0.9)
Monocytes: 7 %
Neutrophils Absolute: 2.9 10*3/uL (ref 1.4–7.0)
Neutrophils: 45 %
Platelets: 258 10*3/uL (ref 150–450)
RBC: 4.19 x10E6/uL (ref 3.77–5.28)
RDW: 11.6 % — ABNORMAL LOW (ref 11.7–15.4)
Retic Ct Pct: 1.1 % (ref 0.6–2.6)
Total Iron Binding Capacity: 275 ug/dL (ref 250–450)
UIBC: 203 ug/dL (ref 131–425)
Vitamin B-12: 870 pg/mL (ref 232–1245)
WBC: 6.5 10*3/uL (ref 3.4–10.8)

## 2023-03-06 LAB — CMP14+EGFR
ALT: 43 IU/L — ABNORMAL HIGH (ref 0–32)
AST: 27 IU/L (ref 0–40)
Albumin/Globulin Ratio: 1.8 (ref 1.2–2.2)
Albumin: 4.2 g/dL (ref 3.8–4.9)
Alkaline Phosphatase: 126 IU/L — ABNORMAL HIGH (ref 44–121)
BUN/Creatinine Ratio: 11 (ref 9–23)
BUN: 9 mg/dL (ref 6–24)
Bilirubin Total: 0.3 mg/dL (ref 0.0–1.2)
CO2: 24 mmol/L (ref 20–29)
Calcium: 9.3 mg/dL (ref 8.7–10.2)
Chloride: 102 mmol/L (ref 96–106)
Creatinine, Ser: 0.82 mg/dL (ref 0.57–1.00)
Globulin, Total: 2.3 g/dL (ref 1.5–4.5)
Glucose: 90 mg/dL (ref 70–99)
Potassium: 4.7 mmol/L (ref 3.5–5.2)
Sodium: 139 mmol/L (ref 134–144)
Total Protein: 6.5 g/dL (ref 6.0–8.5)
eGFR: 86 mL/min/{1.73_m2} (ref >59)

## 2023-03-06 NOTE — Telephone Encounter (Signed)
Patient aware.

## 2023-03-07 ENCOUNTER — Other Ambulatory Visit: Payer: PRIVATE HEALTH INSURANCE

## 2023-03-07 ENCOUNTER — Telehealth: Payer: Self-pay | Admitting: *Deleted

## 2023-03-07 DIAGNOSIS — R7989 Other specified abnormal findings of blood chemistry: Secondary | ICD-10-CM

## 2023-03-07 NOTE — Addendum Note (Signed)
Addended by: Neale Burly on: 03/07/2023 11:54 AM   Modules accepted: Orders

## 2023-03-07 NOTE — Telephone Encounter (Signed)
Patient is aware of lab results and will come in for additional labs.  She reports UTI symptoms, urine frequency, pain while urinating, cramping, lower belly pain. Patient asks if there is OTC treatment?, can she be tested and treated? Or does she need an appointment?

## 2023-03-08 LAB — ALKALINE PHOSPHATASE, ISOENZYMES: Alkaline Phosphatase: 131 IU/L — ABNORMAL HIGH (ref 44–121)

## 2023-03-08 LAB — AMYLASE: Amylase: 66 U/L (ref 31–110)

## 2023-03-12 NOTE — Telephone Encounter (Signed)
Left message to call back  

## 2023-03-13 ENCOUNTER — Telehealth: Payer: Self-pay | Admitting: Family Medicine

## 2023-03-15 LAB — LIPASE: Lipase: 47 U/L (ref 14–72)

## 2023-03-15 LAB — ALKALINE PHOSPHATASE, ISOENZYMES
BONE FRACTION: 24 % (ref 14–68)
LIVER FRACTION: 38 % (ref 18–85)

## 2023-03-17 NOTE — Telephone Encounter (Signed)
Left message making pt aware and to call back if needed. 

## 2023-03-18 NOTE — Telephone Encounter (Signed)
Called pt , she states she is no longer having UTI sxs   Closing call

## 2023-03-26 ENCOUNTER — Ambulatory Visit (INDEPENDENT_AMBULATORY_CARE_PROVIDER_SITE_OTHER): Payer: PRIVATE HEALTH INSURANCE | Admitting: Family Medicine

## 2023-03-26 ENCOUNTER — Encounter: Payer: Self-pay | Admitting: Family Medicine

## 2023-03-26 VITALS — BP 125/63 | HR 55 | Temp 97.4°F | Ht 68.0 in | Wt 198.0 lb

## 2023-03-26 DIAGNOSIS — Z903 Acquired absence of stomach [part of]: Secondary | ICD-10-CM | POA: Diagnosis not present

## 2023-03-26 DIAGNOSIS — R252 Cramp and spasm: Secondary | ICD-10-CM

## 2023-03-26 DIAGNOSIS — M79671 Pain in right foot: Secondary | ICD-10-CM

## 2023-03-26 DIAGNOSIS — M79672 Pain in left foot: Secondary | ICD-10-CM

## 2023-03-26 DIAGNOSIS — E559 Vitamin D deficiency, unspecified: Secondary | ICD-10-CM | POA: Diagnosis not present

## 2023-03-26 MED ORDER — PREDNISONE 20 MG PO TABS
40.0000 mg | ORAL_TABLET | Freq: Every day | ORAL | 0 refills | Status: AC
Start: 2023-03-26 — End: 2023-03-31

## 2023-03-26 NOTE — Patient Instructions (Signed)
[facility] [address] - [place] [address] [phone]

## 2023-03-26 NOTE — Progress Notes (Signed)
Acute Office Visit  Subjective:  Patient ID: Dawn Acevedo, female    DOB: Feb 05, 1971, 52 y.o.   MRN: [id number]  Chief Complaint  Patient presents with   Foot Pain    Cramps in big toe and spasms then ran up right leg and had muscles cramps in upper part of leg.   Foot Pain   Patient is in today for cramping in her big toe. Bottom of both feet have been bothering her. She reports that one day she could "barely walk" on them. Pain started on Thursday or Friday of last week. Reports that it is worse in the morning, improves as morning goes on. Worse after working all day. 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 Endorses numbness and tingling. Rates the pain as 5/10 right now. Has not been taking or trying anything for the pain right now. She was worried about taking medications due to her pancreatitis. Legs feel like they "fall asleep" when she goes to the bathroom. Right great toe will cramp.  Vitamin D deficient years ago.   ROS As per HPI  Objective:  BP 125/63   Pulse (!) 55   Temp (!) 97.4 F (36.3 C) (Temporal)   Ht 5\' 8"  (1.727 m)   Wt 198 lb (89.8 kg)   SpO2 100%   BMI 30.11 kg/m   Physical Exam Constitutional:      General: She is not in acute distress.    Appearance: Normal appearance. She is not ill-appearing, toxic-appearing or diaphoretic.  Cardiovascular:     Rate and Rhythm: Normal rate.     Pulses: Normal pulses.     Heart sounds: Normal heart sounds. No murmur heard.    No gallop.  Pulmonary:     Effort: Pulmonary effort is normal. No respiratory distress.     Breath sounds: Normal breath sounds. No stridor. No wheezing, rhonchi or rales.  Musculoskeletal:     Right foot: Normal range of motion and normal capillary refill. Tenderness present. No swelling, deformity, bunion, Charcot foot, foot drop, prominent metatarsal heads or crepitus. Normal pulse.     Left  foot: Normal range of motion and normal capillary refill. Tenderness present. No swelling, deformity, bunion, Charcot foot, foot drop, prominent metatarsal heads or crepitus. Normal pulse.       Feet:  Feet:     Right foot:     Skin integrity: Skin integrity normal.     Left foot:     Skin integrity: Skin integrity normal.     Comments: Point tenderness  Skin:    General: Skin is warm.     Capillary Refill: Capillary refill takes less than 2 seconds.  Neurological:     General: No focal deficit present.     Mental Status: She is alert and oriented to person, place, and time. Mental status is at baseline.     Motor: No weakness.  Psychiatric:        Mood and Affect: Mood normal.        Behavior: Behavior normal.        Thought Content: Thought content normal.        Judgment: Judgment normal.        03/04/2023    4:49 PM 11/05/2022    4:24 PM 07/10/2022    3:36 PM  Depression screen PHQ 2/9  Decreased Interest 0 0 0  Down, Depressed, Hopeless  0 0 0  PHQ - 2 Score 0 0 0  Altered sleeping 0    Tired, decreased energy 0    Change in appetite 0    Feeling bad or failure about yourself  0    Trouble concentrating 0    Moving slowly or fidgety/restless 0    Suicidal thoughts 0    PHQ-9 Score 0    Difficult doing work/chores Not difficult at all        03/04/2023    4:50 PM 11/05/2022    4:24 PM 02/12/2022    4:39 PM 12/25/2021    4:13 PM  GAD 7 : Generalized Anxiety Score  Nervous, Anxious, on Edge 0 0 0 0  Control/stop worrying 0 0 0 0  Worry too much - different things 0 0 0 0  Trouble relaxing 0 0 0 0  Restless 0 0 0 0  Easily annoyed or irritable 0 0 0 0  Afraid - awful might happen 0 0 0 0  Total GAD 7 Score 0 0 0 0  Anxiety Difficulty Not difficult at all Not difficult at all Not difficult at all Not difficult at all   Assessment & Plan:  1. Bilateral foot pain 2. Cramping of feet Labs as below. Will communicate results to patient once available.  Medication as  below.  Suspect plantar fascitis given location of point tenderness for patient. Discussed with patient conservative management with stretching, prednisone over naproxen due to history of gastric sleeve. Will also check for electrolyte imbalance. She was slightly deficient in folate at last lab, but she has allergy to Folic acid supplement. Will strategize with patient to increase folate in diet.  - Magnesium - predniSONE (DELTASONE) 20 MG tablet; Take 2 tablets (40 mg total) by mouth daily with breakfast for 5 days.  Dispense: 10 tablet; Refill: 0  3. Vitamin D deficiency Patient previously deficient. Not currently taking supplement. Instructed patient to start daily supplement until labs result. Will consider weekly dosing once labs result  - VITAMIN D 25 Hydroxy (Vit-D Deficiency, Fractures)  4. S/P gastric sleeve procedure Patient would like to follow up with Bariatric surgery. Referral authorized. Provided patient with phone number. Will consider Reginal Lutes now that we have ruled out pancreatitis once she has followed up with bariatrics.   The above assessment and management plan was discussed with the patient. The patient verbalized understanding of and has agreed to the management plan using shared-decision making. Patient is aware to call the clinic if they develop any new symptoms or if symptoms fail to improve or worsen. Patient is aware when to return to the clinic for a follow-up visit. Patient educated on when it is appropriate to go to the emergency department.   Neale Burly, DNP-FNP [facility] Medicine [address] [place], [place] [address] [phone]

## 2023-03-27 LAB — MAGNESIUM: Magnesium: 2.1 mg/dL (ref 1.6–2.3)

## 2023-03-27 LAB — VITAMIN D 25 HYDROXY (VIT D DEFICIENCY, FRACTURES): Vit D, 25-Hydroxy: 16.7 ng/mL — ABNORMAL LOW (ref 30.0–100.0)

## 2023-03-27 MED ORDER — VITAMIN D (ERGOCALCIFEROL) 1.25 MG (50000 UNIT) PO CAPS
50000.0000 [IU] | ORAL_CAPSULE | ORAL | 0 refills | Status: AC
Start: 2023-03-27 — End: 2023-06-13

## 2023-03-27 NOTE — Addendum Note (Signed)
Addended by: Neale Burly on: 03/27/2023 08:17 AM   Modules accepted: Orders

## 2023-04-01 ENCOUNTER — Encounter: Payer: PRIVATE HEALTH INSURANCE | Admitting: Family Medicine

## 2023-04-09 ENCOUNTER — Telehealth: Payer: Self-pay | Admitting: Family Medicine

## 2023-04-10 NOTE — Telephone Encounter (Signed)
Sent mychart message informing patient to contact [facility]

## 2023-04-24 ENCOUNTER — Telehealth: Payer: Self-pay | Admitting: Family Medicine

## 2023-04-24 DIAGNOSIS — R634 Abnormal weight loss: Secondary | ICD-10-CM

## 2023-04-24 NOTE — Telephone Encounter (Signed)
Pt asking to go back on wegovy and get a new rx for WEGOVY 1.7MG . use [facility]. She revision for surgery-insurance will not accept.

## 2023-04-25 MED ORDER — SEMAGLUTIDE-WEIGHT MANAGEMENT 0.25 MG/0.5ML ~~LOC~~ SOAJ: 0.2500 mg | SUBCUTANEOUS | 0 refills | Status: DC

## 2023-04-25 MED ORDER — SEMAGLUTIDE-WEIGHT MANAGEMENT 1 MG/0.5ML ~~LOC~~ SOAJ: 1.0000 mg | SUBCUTANEOUS | 0 refills | Status: DC

## 2023-04-25 MED ORDER — SEMAGLUTIDE-WEIGHT MANAGEMENT 0.5 MG/0.5ML ~~LOC~~ SOAJ: 0.5000 mg | SUBCUTANEOUS | 0 refills | Status: DC

## 2023-04-25 NOTE — Telephone Encounter (Signed)
Patient aware.

## 2023-04-29 ENCOUNTER — Ambulatory Visit (INDEPENDENT_AMBULATORY_CARE_PROVIDER_SITE_OTHER): Payer: PRIVATE HEALTH INSURANCE

## 2023-04-29 ENCOUNTER — Encounter: Payer: Self-pay | Admitting: Family Medicine

## 2023-04-29 ENCOUNTER — Ambulatory Visit (INDEPENDENT_AMBULATORY_CARE_PROVIDER_SITE_OTHER): Payer: PRIVATE HEALTH INSURANCE | Admitting: Family Medicine

## 2023-04-29 VITALS — BP 122/75 | HR 49 | Temp 98.3°F | Ht 68.0 in | Wt 206.0 lb

## 2023-04-29 DIAGNOSIS — M7750 Other enthesopathy of unspecified foot: Secondary | ICD-10-CM

## 2023-04-29 DIAGNOSIS — Z6831 Body mass index (BMI) 31.0-31.9, adult: Secondary | ICD-10-CM

## 2023-04-29 DIAGNOSIS — M79672 Pain in left foot: Secondary | ICD-10-CM

## 2023-04-29 DIAGNOSIS — L989 Disorder of the skin and subcutaneous tissue, unspecified: Secondary | ICD-10-CM | POA: Diagnosis not present

## 2023-04-29 DIAGNOSIS — R001 Bradycardia, unspecified: Secondary | ICD-10-CM

## 2023-04-29 DIAGNOSIS — M79671 Pain in right foot: Secondary | ICD-10-CM | POA: Diagnosis not present

## 2023-04-29 DIAGNOSIS — R634 Abnormal weight loss: Secondary | ICD-10-CM

## 2023-04-29 NOTE — Progress Notes (Unsigned)
Acute Office Visit  Subjective:  Patient ID: Dawn Acevedo, female    DOB: 1971/06/20, 52 y.o.   MRN: [id number]  Chief Complaint  Patient presents with   Foot Pain   Patient is in today for continued foot pain. States that she bought insoles. Occurs in both feet, but is worse in right. Feels that she is getting more pain in her left because she is compensating.  Tried prednisone at last appt but did not notice a significant difference. She is following with orthopedics for her knee pain, states that they will not do replacement surgery due to her age. Reports that she wants to walk, but has pain.   Bradycardia  States that it has been low the last few times that she has been in clinic. Denies symptoms. Has not followed up for this. States that with walking it increases.  Skin lesion  Has skin lesion that she has had for several years. Reports that she was once prescribed a cream for it, but it did not improve. Reports that she followed up with dermatology and received a biopsy. Believes that she went to [facility]. Patient is unable to recall if she had biopsy on this site or another site.   ROS As per hpi   Objective:  BP 122/75   Pulse (!) 49   Temp 98.3 F (36.8 C)   Ht 5\' 8"  (1.727 m)   Wt 206 lb (93.4 kg)   SpO2 99%   BMI 31.32 kg/m    Physical Exam Constitutional:      General: She is awake. She is not in acute distress.    Appearance: Normal appearance. She is well-developed and well-groomed. She is not ill-appearing, toxic-appearing or diaphoretic.  Cardiovascular:     Rate and Rhythm: Bradycardia present.     Pulses: Normal pulses.          Radial pulses are 2+ on the right side and 2+ on the left side.       Posterior tibial pulses are 2+ on the right side and 2+ on the left side.     Heart sounds: Normal heart sounds. No murmur heard.    No gallop.  Pulmonary:     Effort: Pulmonary effort is normal. No respiratory distress.     Breath sounds:  Normal breath sounds. No stridor. No wheezing, rhonchi or rales.  Musculoskeletal:     Cervical back: Full passive range of motion without pain and neck supple.     Right lower leg: No edema.     Left lower leg: No edema.     Right ankle: Swelling present. No deformity. Normal range of motion. Normal pulse.     Left ankle: No swelling or deformity. Normal pulse.     Right foot: Normal range of motion and normal capillary refill. Tenderness present. No swelling, deformity, foot drop, bony tenderness or crepitus. Normal pulse.     Left foot: Normal range of motion and normal capillary refill. Tenderness present. No swelling, deformity, foot drop, bony tenderness or crepitus. Normal pulse.  Skin:    General: Skin is warm.     Capillary Refill: Capillary refill takes less than 2 seconds.     Comments: ~1cm round hyperpigmented, dry, flaking, flat skin lesion lateral aspect of right foot   Neurological:     General: No focal deficit present.     Mental Status: She is alert, oriented to person, place, and time and easily aroused. Mental status is at  baseline.     GCS: GCS eye subscore is 4. GCS verbal subscore is 5. GCS motor subscore is 6.     Motor: No weakness.  Psychiatric:        Attention and Perception: Attention and perception normal.        Mood and Affect: Mood and affect normal.        Speech: Speech normal.        Behavior: Behavior normal. Behavior is cooperative.        Thought Content: Thought content normal. Thought content does not include homicidal or suicidal ideation. Thought content does not include homicidal or suicidal plan.        Cognition and Memory: Cognition and memory normal.        Judgment: Judgment normal.     Assessment & Plan:  1. Bilateral foot pain Will refer to PT as below. Discussed with patient stretching that she can perform for suspected heel spur. Provided written and verbal patient education. Will complete imaging as below to confirm bone spur.  -  Ambulatory referral to Physical Therapy - DG Foot Complete Right; Future - DG Ankle Complete Right; Future  2. Bone spur of foot See above.  - Ambulatory referral to Physical Therapy - DG Foot Complete Right; Future - DG Ankle Complete Right; Future  3. Bradycardia Patient has had bradycardia as last appt and today. EKG reviewed in office today. Confirmed Sinus bradycardia with inverted T waves in leads V1 and III. Referral placed to Cardiology as below for further evaluation.  - Ambulatory referral to Cardiology - EKG 12-Lead  4. Skin lesion Patient previously treated by [facility] and [facility], which is now out of business. Will request records from [facility]. Referral placed as below for patient to follow on skin lesion.  - Ambulatory referral to Dermatology  The above assessment and management plan was discussed with the patient. The patient verbalized understanding of and has agreed to the management plan using shared-decision making. Patient is aware to call the clinic if they develop any new symptoms or if symptoms fail to improve or worsen. Patient is aware when to return to the clinic for a follow-up visit. Patient educated on when it is appropriate to go to the emergency department.   Return in about 3 months (around 07/30/2023), or if symptoms worsen or fail to improve, for CPE with fasting labs .  Neale Burly, DNP-FNP [facility] [address] [place], [place] [address] [phone]

## 2023-04-30 ENCOUNTER — Encounter: Payer: Self-pay | Admitting: Family Medicine

## 2023-05-01 ENCOUNTER — Telehealth: Payer: Self-pay | Admitting: Family Medicine

## 2023-05-01 ENCOUNTER — Other Ambulatory Visit: Payer: Self-pay | Admitting: Family Medicine

## 2023-05-02 ENCOUNTER — Other Ambulatory Visit (HOSPITAL_COMMUNITY): Payer: Self-pay

## 2023-05-02 NOTE — Telephone Encounter (Signed)
Ozempic is only approved for type 2 diabetes. No indication of diabetes on patient's chart. Please advise.   Key: [id number]

## 2023-05-06 MED ORDER — WEGOVY 1 MG/0.5ML ~~LOC~~ SOAJ
1.0000 mg | SUBCUTANEOUS | 0 refills | Status: DC
Start: 2023-07-05 — End: 2023-07-11

## 2023-05-06 MED ORDER — WEGOVY 0.5 MG/0.5ML ~~LOC~~ SOAJ
0.5000 mg | SUBCUTANEOUS | 0 refills | Status: DC
Start: 2023-06-05 — End: 2023-06-10

## 2023-05-06 MED ORDER — WEGOVY 0.25 MG/0.5ML ~~LOC~~ SOAJ
0.2500 mg | SUBCUTANEOUS | 0 refills | Status: AC
Start: 2023-05-06 — End: 2023-05-28

## 2023-05-06 NOTE — Progress Notes (Signed)
See DG Ankle Complete Right note

## 2023-05-06 NOTE — Telephone Encounter (Signed)
Dawn Acevedo (Key: [id number]) Rx #: [id number] OZHYQM 0.25MG /0.5ML auto-injectors  Sent to plan

## 2023-05-06 NOTE — Progress Notes (Signed)
Xray shows heel spur on right foot and slight osteoarthritis. Recommend rest, ice, compression, elevation, NSAIDs for pain. Recommend cushioned shoe pads and following up with PT.  Has been approved for [facility]. [place], [place] [address] [phone]

## 2023-05-07 ENCOUNTER — Other Ambulatory Visit (HOSPITAL_COMMUNITY): Payer: Self-pay

## 2023-05-07 NOTE — Telephone Encounter (Signed)
Patient Advocate Encounter  Prior Authorization for [employer] 0.25MG /0.5ML auto-injectors has been approved with [employer].    PA# [id number] Effective dates: 05/06/23 through 11/05/23  Per [facility] test claim, copay for 28 days supply is $24.99 (with eVoucher)

## 2023-05-08 ENCOUNTER — Ambulatory Visit: Payer: PRIVATE HEALTH INSURANCE

## 2023-05-13 ENCOUNTER — Other Ambulatory Visit: Payer: Self-pay

## 2023-05-13 ENCOUNTER — Encounter: Payer: Self-pay | Admitting: Physical Therapy

## 2023-05-13 ENCOUNTER — Ambulatory Visit: Payer: PRIVATE HEALTH INSURANCE | Attending: Family Medicine | Admitting: Physical Therapy

## 2023-05-13 DIAGNOSIS — M79672 Pain in left foot: Secondary | ICD-10-CM | POA: Insufficient documentation

## 2023-05-13 DIAGNOSIS — M7750 Other enthesopathy of unspecified foot: Secondary | ICD-10-CM | POA: Diagnosis not present

## 2023-05-13 DIAGNOSIS — M79671 Pain in right foot: Secondary | ICD-10-CM | POA: Insufficient documentation

## 2023-05-13 NOTE — Therapy (Signed)
OUTPATIENT PHYSICAL THERAPY LOWER EXTREMITY EVALUATION   Patient Name: Dawn Acevedo MRN: [id number] DOB:10-28-1971, 52 y.o., female Today's Date: 05/13/2023  END OF SESSION:  PT End of Session - 05/13/23 1419     Visit Number 1    Number of Visits 6    Date for PT Re-Evaluation 06/24/23    PT Start Time 0111    PT Stop Time 0147    PT Time Calculation (min) 36 min    Activity Tolerance Patient tolerated treatment well    Behavior During Therapy WFL for tasks assessed/performed             Past Medical History:  Diagnosis Date   Anemia    Arthritis    RIGHT knee   History of vitamin D deficiency    Past Surgical History:  Procedure Laterality Date   DILITATION & CURRETTAGE/HYSTROSCOPY WITH NOVASURE ABLATION N/A 10/11/2014   Procedure: DILATATION & CURETTAGE/HYSTEROSCOPY WITH NOVASURE ABLATION;  Surgeon: Serita Kyle, MD;  Location: [facility];  Service: Gynecology;  Laterality: N/A;   LAPAROSCOPIC GASTRIC SLEEVE RESECTION     gastric sleeve   LAPAROSCOPIC TUBAL LIGATION Bilateral 10/11/2014   Procedure: LAPAROSCOPIC BILATERAL TUBAL LIGATION with Bipolar Cautery;  Surgeon: Serita Kyle, MD;  Location: [facility];  Service: Gynecology;  Laterality: Bilateral;   WISDOM TOOTH EXTRACTION     Patient Active Problem List   Diagnosis Date Noted   Weight loss 02/12/2022   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  THERAPY DIAG:  Pain in right foot - Plan: PT plan of care cert/re-cert  Rationale for Evaluation and Treatment: Rehabilitation  ONSET DATE: Several weeks.  SUBJECTIVE:   SUBJECTIVE STATEMENT: The  patient presents to the clinic with c/o right foot pain that began as a cramping type pain in her right great toe.  She states that her pain is very high when first out of bed in the morning and she has to "tip-toe".  She c/o pain on the bottom of the foot.  Her pain is rated at this time at ~3-4/10.    PERTINENT HISTORY: Right knee pain (beginning to have left knee pain due to compensatory gait). PAIN:  Are you having pain? Yes: NPRS scale: 4/10 Pain location: Right foot (plantar surface). Pain description: Sharp. Aggravating factors: Weight bearing first thing in  morning. Relieving factors: Rest.  PRECAUTIONS: None  WEIGHT BEARING RESTRICTIONS: No  FALLS:  Has patient fallen in last 6 months? No  LIVING ENVIRONMENT: Lives in: House/apartment Has following equipment at home: None  OCCUPATION: Works at [employer]  PLOF: Independent  PATIENT GOALS: Not have foot pain.  OBJECTIVE:   DIAGNOSTIC FINDINGS: Right ankle:   The ankle mortise is symmetric and intact. Mild distal medial malleolar degenerative spurring. Mild lateral malleolar soft tissue swelling. No acute fracture dislocation.   Right foot:   Small plantar calcaneal heel spur. Minimal great toe tarsometatarsal joint space narrowing with mild peripheral osteophytosis. No acute fracture or dislocation.   IMPRESSION: 1. Small plantar calcaneal heel spur. 2. Minimal great toe tarsometatarsal osteoarthritis.   PALPATION: Minimal palpable pain today from right calcaneus and along plantar  fascia.  Patient reports severe pain in this region when she first gets up in the morning.  LOWER EXTREMITY ROM:  Full active right ankle range of motion.  LOWER EXTREMITY MMT: Normal right ankle strength.   GAIT: Purposeful gait pattern due to right knee pain.    PATIENT EDUCATION:  Education details: Discussed proper footwear.  Plantar fascia stretch and standing right Gatroc stretch.  Instruct in self-plantar  fascia massage. Person educated: Patient Education method: Medical illustrator Education comprehension: verbalized understanding and returned demonstration  HOME EXERCISE PROGRAM: As above.  ASSESSMENT:  CLINICAL IMPRESSION: The patient presents to OPPT with c/o right foot pain over the last several weeks.  Her morning pain-level is severe.  Her CC is pain from her calcaneus and along her plantar fascia especially on the medial (great toe) side.  She has normal right ankle range of motion and strength.  We discussed good supportive footwear, gastroc stretching and self-massage to her right plantar fascia.  Patient will benefit from skilled physical therapy intervention to address pain.  OBJECTIVE IMPAIRMENTS: Abnormal gait, decreased activity tolerance, and pain.   ACTIVITY LIMITATIONS: locomotion level  REHAB POTENTIAL: Excellent  CLINICAL DECISION MAKING: Stable/uncomplicated  EVALUATION COMPLEXITY: Low   GOALS:  SHORT TERM GOALS: Target date: 06/24/23.  Ind with a HEP. Goal status: INITIAL  2.  Morning right foot pain-level not > 3/10.  Goal status: INITIAL  3.  Walk a community distance with right foot pain not > 3/10.  Goal status: INITIAL  PLAN:  PT FREQUENCY: 1-2x/week  PT DURATION: 6 visits.  PLANNED INTERVENTIONS: Therapeutic exercises, Therapeutic activity, Patient/Family education, Self Care, Electrical stimulation, Cryotherapy, Moist heat, Vasopneumatic device, Ultrasound, and Manual therapy  PLAN FOR NEXT SESSION: Small soundhead combo e'stim/US (pulsed), STW/M.   Nickol Collister, [place], PT 05/13/2023, 2:43 PM

## 2023-06-10 ENCOUNTER — Telehealth: Payer: Self-pay | Admitting: Family Medicine

## 2023-06-10 DIAGNOSIS — R634 Abnormal weight loss: Secondary | ICD-10-CM

## 2023-06-10 DIAGNOSIS — Z6831 Body mass index (BMI) 31.0-31.9, adult: Secondary | ICD-10-CM

## 2023-06-10 MED ORDER — WEGOVY 0.5 MG/0.5ML ~~LOC~~ SOAJ
0.5000 mg | SUBCUTANEOUS | 0 refills | Status: AC
Start: 2023-06-10 — End: 2023-07-02

## 2023-06-10 NOTE — Telephone Encounter (Signed)
Rx redirected to [facility]. Patient notified

## 2023-07-02 ENCOUNTER — Other Ambulatory Visit: Payer: Self-pay | Admitting: Family Medicine

## 2023-07-02 DIAGNOSIS — Z6831 Body mass index (BMI) 31.0-31.9, adult: Secondary | ICD-10-CM

## 2023-07-02 DIAGNOSIS — R634 Abnormal weight loss: Secondary | ICD-10-CM

## 2023-07-03 DIAGNOSIS — M722 Plantar fascial fibromatosis: Secondary | ICD-10-CM | POA: Insufficient documentation

## 2023-07-09 ENCOUNTER — Other Ambulatory Visit: Payer: Self-pay | Admitting: Family Medicine

## 2023-07-09 DIAGNOSIS — Z6831 Body mass index (BMI) 31.0-31.9, adult: Secondary | ICD-10-CM

## 2023-07-09 DIAGNOSIS — R634 Abnormal weight loss: Secondary | ICD-10-CM

## 2023-07-11 ENCOUNTER — Telehealth: Payer: Self-pay | Admitting: Family Medicine

## 2023-07-11 DIAGNOSIS — R634 Abnormal weight loss: Secondary | ICD-10-CM

## 2023-07-11 DIAGNOSIS — Z6831 Body mass index (BMI) 31.0-31.9, adult: Secondary | ICD-10-CM

## 2023-07-11 MED ORDER — WEGOVY 1 MG/0.5ML ~~LOC~~ SOAJ
1.0000 mg | SUBCUTANEOUS | 0 refills | Status: AC
Start: 2023-07-11 — End: 2023-08-02

## 2023-07-11 NOTE — Telephone Encounter (Signed)
Patient aware.

## 2023-08-02 ENCOUNTER — Other Ambulatory Visit: Payer: Self-pay | Admitting: Family Medicine

## 2023-08-02 DIAGNOSIS — R634 Abnormal weight loss: Secondary | ICD-10-CM

## 2023-08-02 DIAGNOSIS — Z6831 Body mass index (BMI) 31.0-31.9, adult: Secondary | ICD-10-CM

## 2023-08-03 DIAGNOSIS — R001 Bradycardia, unspecified: Secondary | ICD-10-CM | POA: Insufficient documentation

## 2023-08-03 NOTE — Progress Notes (Deleted)
Cardiology Office Note   Date:  08/03/2023   ID:  Dawn Acevedo, DOB Aug 02, 1971, MRN [id number]  PCP:  Arrie Senate, FNP  Cardiologist:   None Referring:  ***  No chief complaint on file.     History of Present Illness: Dawn Acevedo is a 52 y.o. female who was referred by *** for evaluation of bradycardia.  ***   Past Medical History:  Diagnosis Date   Anemia    Arthritis    RIGHT knee   History of vitamin D deficiency     Past Surgical History:  Procedure Laterality Date   DILITATION & CURRETTAGE/HYSTROSCOPY WITH NOVASURE ABLATION N/A 10/11/2014   Procedure: DILATATION & CURETTAGE/HYSTEROSCOPY WITH NOVASURE ABLATION;  Surgeon: Serita Kyle, MD;  Location: [facility];  Service: Gynecology;  Laterality: N/A;   LAPAROSCOPIC GASTRIC SLEEVE RESECTION     gastric sleeve   LAPAROSCOPIC TUBAL LIGATION Bilateral 10/11/2014   Procedure: LAPAROSCOPIC BILATERAL TUBAL LIGATION with Bipolar Cautery;  Surgeon: Serita Kyle, MD;  Location: [facility];  Service: Gynecology;  Laterality: Bilateral;   WISDOM TOOTH EXTRACTION       Current Outpatient Medications  Medication Sig Dispense Refill   Vitamin D, Ergocalciferol, (DRISDOL) 1.25 MG (50000 UNIT) CAPS capsule Take 50,000 Units by mouth every 7 (seven) days.     No current facility-administered medications for this visit.    Allergies:   Folic acid    Social History:  The patient  reports that she has never smoked. She has never used smokeless tobacco. She reports that she does not drink alcohol and does not use drugs.   Family History:  The patient's ***family history includes Diabetes in her father; Heart attack in her father and mother; Heart disease in her mother; Hypertension in her father and mother; Ovarian cancer in her maternal grandmother; Prostate cancer in her father.    ROS:  Please see the history of present illness.   Otherwise, review of systems are positive for {NONE  DEFAULTED:18576}.   All other systems are reviewed and negative.    PHYSICAL EXAM: VS:  There were no vitals taken for this visit. , BMI There is no height or weight on file to calculate BMI. GENERAL:  Well appearing HEENT:  Pupils equal round and reactive, fundi not visualized, oral mucosa unremarkable NECK:  No jugular venous distention, waveform within normal limits, carotid upstroke brisk and symmetric, no bruits, no thyromegaly LYMPHATICS:  No cervical, inguinal adenopathy LUNGS:  Clear to auscultation bilaterally BACK:  No CVA tenderness CHEST:  Unremarkable HEART:  PMI not displaced or sustained,S1 and S2 within normal limits, no S3, no S4, no clicks, no rubs, *** murmurs ABD:  Flat, positive bowel sounds normal in frequency in pitch, no bruits, no rebound, no guarding, no midline pulsatile mass, no hepatomegaly, no splenomegaly EXT:  2 plus pulses throughout, no edema, no cyanosis no clubbing SKIN:  No rashes no nodules NEURO:  Cranial nerves II through XII grossly intact, motor grossly intact throughout PSYCH:  Cognitively intact, oriented to person place and time    EKG:        Recent Labs: 03/04/2023: ALT 43; BUN 9; Creatinine, Ser 0.82; Hemoglobin 13.0; Platelets 258; Potassium 4.7; Sodium 139 03/26/2023: Magnesium 2.1    Lipid Panel    Component Value Date/Time   CHOL 130 11/05/2022 1640   TRIG 43 11/05/2022 1640   HDL 70 11/05/2022 1640   CHOLHDL 1.9 11/05/2022 1640   LDLCALC 49 11/05/2022  1640      Wt Readings from Last 3 Encounters:  04/29/23 206 lb (93.4 kg)  03/26/23 198 lb (89.8 kg)  03/04/23 193 lb (87.5 kg)      Other studies Reviewed: Additional studies/ records that were reviewed today include: ***. Review of the above records demonstrates:  Please see elsewhere in the note.  ***   ASSESSMENT AND PLAN:  Bradycardia:  ***   Current medicines are reviewed at length with the patient today.  The patient {ACTIONS; HAS/DOES NOT HAVE:19233}  concerns regarding medicines.  The following changes have been made:  {PLAN; NO CHANGE:13088:s}  Labs/ tests ordered today include: *** No orders of the defined types were placed in this encounter.    Disposition:   FU with ***    Signed, Rollene Rotunda, MD  08/03/2023 8:27 PM    [facility]

## 2023-08-05 NOTE — Progress Notes (Signed)
COVID Vaccine received:  []  No [x]  Yes Date of any COVID positive Test in last 90 days:  PCP - Neale Burly FNP Cardiologist -   Chest x-ray -  EKG -  04/29/23 Epic Stress Test -  ECHO -  Cardiac Cath -   Bowel Prep - []  No  []   Yes ______  Pacemaker / ICD device []  No []  Yes   Spinal Cord Stimulator:[]  No []  Yes       History of Sleep Apnea? []  No []  Yes   CPAP used?- []  No []  Yes    Does the patient monitor blood sugar?          []  No []  Yes  []  N/A  Patient has: []  NO Hx DM   []  Pre-DM                 []  DM1  []   DM2 Does patient have a [employer] or [employer]? []  No []  Yes   Fasting Blood Sugar Ranges-  Checks Blood Sugar _____ times a day  GLP1 agonist / usual dose -  GLP1 instructions:  SGLT-2 inhibitors / usual dose -  SGLT-2 instructions:   Blood Thinner / Instructions: Aspirin Instructions:  Comments:   Activity level: Patient is able / unable to climb a flight of stairs without difficulty; []  No CP  []  No SOB, but would have ___   Patient can / can not perform ADLs without assistance.   Anesthesia review:   Patient denies shortness of breath, fever, cough and chest pain at PAT appointment.  Patient verbalized understanding and agreement to the Pre-Surgical Instructions that were given to them at this PAT appointment. Patient was also educated of the need to review these PAT instructions again prior to his/her surgery.I reviewed the appropriate phone numbers to call if they have any and questions or concerns.

## 2023-08-05 NOTE — Patient Instructions (Signed)
SURGICAL WAITING ROOM VISITATION  Patients having surgery or a procedure may have no more than 2 support people in the waiting area - these visitors may rotate.    Children under the age of 19 must have an adult with them who is not the patient.  Due to an increase in RSV and influenza rates and associated hospitalizations, children ages 80 and under may not visit patients in [facility] hospitals.  If the patient needs to stay at the hospital during part of their recovery, the visitor guidelines for inpatient rooms apply. Pre-op nurse will coordinate an appropriate time for 1 support person to accompany patient in pre-op.  This support person may not rotate.    Please refer to the [facility] website for the visitor guidelines for Inpatients (after your surgery is over and you are in a regular room).       Your procedure is scheduled on: 08/18/23   Report to [facility] Main Entrance    Report to admitting at  1:45 PM   Call this number if you have problems the morning of surgery [phone]   Do not eat food :After Midnight.   After Midnight you may have the following liquids until 1PM DAY OF SURGERY  Water Non-Citrus Juices (without pulp, NO RED-Apple, White grape, White cranberry) Black Coffee (NO MILK/CREAM OR CREAMERS, sugar ok)  Clear Tea (NO MILK/CREAM OR CREAMERS, sugar ok) regular and decaf                             Plain Jell-O (NO RED)                                           Fruit ices (not with fruit pulp, NO RED)                                     Popsicles (NO RED)                                                               Sports drinks like Gatorade (NO RED)              d    The day of surgery:  Drink ONE (1) Pre-Surgery Clear Ensure 1 PM the day of surgery. Drink in one sitting. Do not sip.  This drink was given to you during your hospital  pre-op appointment visit. Nothing else to drink after completing the  Pre-Surgery Clear Ensure      Oral Hygiene is also important to reduce your risk of infection.                                    Remember - BRUSH YOUR TEETH THE MORNING OF SURGERY WITH YOUR REGULAR TOOTHPASTE  DENTURES WILL BE REMOVED PRIOR TO SURGERY PLEASE DO NOT APPLY "Poly grip" OR ADHESIVES!!!   Do NOT smoke after Midnight   Stop all vitamins and herbal supplements 7 days before surgery.  Take these medicines the morning of surgery              You may not have any metal on your body including hair pins, jewelry, and body piercing             Do not wear make-up, lotions, powders, perfumes or deodorant  Do not wear nail polish including gel and S&S, artificial/acrylic nails, or any other type of covering on natural nails including finger and toenails. If you have artificial nails, gel coating, etc. that needs to be removed by a nail salon please have this removed prior to surgery or surgery may need to be canceled/ delayed if the surgeon/ anesthesia feels like they are unable to be safely monitored.   Do not shave  48 hours prior to surgery.    Do not bring valuables to the hospital. [facility] IS NOT             RESPONSIBLE   FOR VALUABLES.   Contacts, glasses, dentures or bridgework may not be worn into surgery.  DO NOT BRING YOUR HOME MEDICATIONS TO THE HOSPITAL. PHARMACY WILL DISPENSE MEDICATIONS LISTED ON YOUR MEDICATION LIST TO YOU DURING YOUR ADMISSION IN THE HOSPITAL!    Patients discharged on the day of surgery will not be allowed to drive home.  Someone NEEDS to stay with you for the first 24 hours after anesthesia.   Special Instructions: Bring a copy of your healthcare power of attorney and living will documents the day of surgery if you haven't scanned them before.              Please read over the following fact sheets you were given: IF YOU HAVE QUESTIONS ABOUT YOUR PRE-OP INSTRUCTIONS PLEASE CALL [phone] Dawn Acevedo   If you received a COVID test during your pre-op visit  it is  requested that you wear a mask when out in public, stay away from anyone that may not be feeling well and notify your surgeon if you develop symptoms. If you test positive for Covid or have been in contact with anyone that has tested positive in the last 10 days please notify you surgeon.    [place] - Preparing for Surgery Before surgery, you can play an important role.  Because skin is not sterile, your skin needs to be as free of germs as possible.  You can reduce the number of germs on your skin by washing with CHG (chlorahexidine gluconate) soap before surgery.  CHG is an antiseptic cleaner which kills germs and bonds with the skin to continue killing germs even after washing. Please DO NOT use if you have an allergy to CHG or antibacterial soaps.  If your skin becomes reddened/irritated stop using the CHG and inform your nurse when you arrive at Short Stay. Do not shave (including legs and underarms) for at least 48 hours prior to the first CHG shower.  You may shave your face/neck.  Please follow these instructions carefully:  1.  Shower with CHG Soap the night before surgery and the  morning of surgery.  2.  If you choose to wash your hair, wash your hair first as usual with your normal  shampoo.  3.  After you shampoo, rinse your hair and body thoroughly to remove the shampoo.                             4.  Use CHG as you would  any other liquid soap.  You can apply chg directly to the skin and wash.  Gently with a scrungie or clean washcloth.  5.  Apply the CHG Soap to your body ONLY FROM THE NECK DOWN.   Do   not use on face/ open                           Wound or open sores. Avoid contact with eyes, ears mouth and   genitals (private parts).                       Wash face,  Genitals (private parts) with your normal soap.             6.  Wash thoroughly, paying special attention to the area where your    surgery  will be performed.  7.  Thoroughly rinse your body with warm water from  the neck down.  8.  DO NOT shower/wash with your normal soap after using and rinsing off the CHG Soap.                9.  Pat yourself dry with a clean towel.            10.  Wear clean pajamas.            11.  Place clean sheets on your bed the night of your first shower and do not  sleep with pets. Day of Surgery : Do not apply any lotions/deodorants the morning of surgery.  Please wear clean clothes to the hospital/surgery center.  FAILURE TO FOLLOW THESE INSTRUCTIONS MAY RESULT IN THE CANCELLATION OF YOUR SURGERY  PATIENT SIGNATURE_________________________________  NURSE SIGNATURE__________________________________  ________________________________________________________________________Incentive Dawn Acevedo (Watch this video at home: [email or web address])  An incentive spirometer is a tool that can help keep your lungs clear and active. This tool measures how well you are filling your lungs with each breath. Taking long deep breaths may help reverse or decrease the chance of developing breathing (pulmonary) problems (especially infection) following: A long period of time when you are unable to move or be active. BEFORE THE PROCEDURE  If the spirometer includes an indicator to show your best effort, your nurse or respiratory therapist will set it to a desired goal. If possible, sit up straight or lean slightly forward. Try not to slouch. Hold the incentive spirometer in an upright position. INSTRUCTIONS FOR USE  Sit on the edge of your bed if possible, or sit up as far as you can in bed or on a chair. Hold the incentive spirometer in an upright position. Breathe out normally. Place the mouthpiece in your mouth and seal your lips tightly around it. Breathe in slowly and as deeply as possible, raising the piston or the ball toward the top of the column. Hold your breath for 3-5 seconds or for as long as possible. Allow the piston or ball to fall to the bottom of  the column. Remove the mouthpiece from your mouth and breathe out normally. Rest for a few seconds and repeat Steps 1 through 7 at least 10 times every 1-2 hours when you are awake. Take your time and take a few normal breaths between deep breaths. The spirometer may include an indicator to show your best effort. Use the indicator as a goal to work toward during each repetition. After each set of 10 deep breaths, practice coughing to be sure your lungs are clear.  If you have an incision (the cut made at the time of surgery), support your incision when coughing by placing a pillow or rolled up towels firmly against it. Once you are able to get out of bed, walk around indoors and cough well. You may stop using the incentive spirometer when instructed by your caregiver.  RISKS AND COMPLICATIONS Take your time so you do not get dizzy or light-headed. If you are in pain, you may need to take or ask for pain medication before doing incentive spirometry. It is harder to take a deep breath if you are having pain. AFTER USE Rest and breathe slowly and easily. It can be helpful to keep track of a log of your progress. Your caregiver can provide you with a simple table to help with this. If you are using the spirometer at home, follow these instructions: SEEK MEDICAL CARE IF:  You are having difficultly using the spirometer. You have trouble using the spirometer as often as instructed. Your pain medication is not giving enough relief while using the spirometer. You develop fever of 100.5 F (38.1 C) or higher. SEEK IMMEDIATE MEDICAL CARE IF:  You cough up bloody sputum that had not been present before. You develop fever of 102 F (38.9 C) or greater. You develop worsening pain at or near the incision site. MAKE SURE YOU:  Understand these instructions. Will watch your condition. Will get help right away if you are not doing well or get worse. Document Released: 03/24/2007 Document Revised:  02/03/2012 Document Reviewed: 05/25/2007 [facility] Patient Information 2014 [place], [place].

## 2023-08-06 ENCOUNTER — Ambulatory Visit: Payer: PRIVATE HEALTH INSURANCE | Admitting: Cardiology

## 2023-08-06 DIAGNOSIS — R001 Bradycardia, unspecified: Secondary | ICD-10-CM

## 2023-08-07 ENCOUNTER — Encounter: Payer: PRIVATE HEALTH INSURANCE | Admitting: Family Medicine

## 2023-08-07 ENCOUNTER — Encounter (HOSPITAL_COMMUNITY)
Admission: RE | Admit: 2023-08-07 | Discharge: 2023-08-07 | Disposition: A | Discharge status: Home / Self Care | Payer: PRIVATE HEALTH INSURANCE | Source: Ambulatory Visit | Attending: Anesthesiology | Admitting: Anesthesiology

## 2023-08-07 DIAGNOSIS — D649 Anemia, unspecified: Secondary | ICD-10-CM

## 2023-08-08 NOTE — Progress Notes (Signed)
COVID Vaccine Completed:  Yes  Date of COVID positive in last 90 days:  PCP - Neale Burly, FNP Cardiologist - Rollene Rotunda, MD (new patient appt 10-29-23)  Chest x-ray -  EKG - 04-29-23 Epic Stress Test -  ECHO -  Cardiac Cath -  Pacemaker/ICD device last checked: Spinal Cord Stimulator:  Bowel Prep -   Sleep Study -  CPAP -   Fasting Blood Sugar -  Checks Blood Sugar _____ times a day  Last dose of GLP1 agonist-  N/A GLP1 instructions:  N/A   Last dose of SGLT-2 inhibitors-  N/A SGLT-2 instructions: N/A   Blood Thinner Instructions:  Time Aspirin Instructions: Last Dose:  Activity level:  Can go up a flight of stairs and perform activities of daily living without stopping and without symptoms of chest pain or shortness of breath.  Able to exercise without symptoms  Unable to go up a flight of stairs without symptoms of     Anesthesia review:  Referred to cardiology for marked bradycardia.  Appt with Dr. Antoine Poche on 10-29-23  Patient denies shortness of breath, fever, cough and chest pain at PAT appointment  Patient verbalized understanding of instructions that were given to them at the PAT appointment. Patient was also instructed that they will need to review over the PAT instructions again at home before surgery.

## 2023-08-08 NOTE — Patient Instructions (Signed)
SURGICAL WAITING ROOM VISITATION Patients having surgery or a procedure may have no more than 2 support people in the waiting area - these visitors may rotate.    Children under the age of 41 must have an adult with them who is not the patient.  If the patient needs to stay at the hospital during part of their recovery, the visitor guidelines for inpatient rooms apply. Pre-op nurse will coordinate an appropriate time for 1 support person to accompany patient in pre-op.  This support person may not rotate.    Please refer to the [facility] website for the visitor guidelines for Inpatients (after your surgery is over and you are in a regular room).       Your procedure is scheduled on: 08-18-23   Report to [facility] Main Entrance    Report to admitting at 1:45 PM   Call this number if you have problems the morning of surgery [phone]   Do not eat food :After Midnight.   After Midnight you may have the following liquids until 1:00 PM DAY OF SURGERY  Water Non-Citrus Juices (without pulp, NO RED-Apple, White grape, White cranberry) Black Coffee (NO MILK/CREAM OR CREAMERS, sugar ok)  Clear Tea (NO MILK/CREAM OR CREAMERS, sugar ok) regular and decaf                             Plain Jell-O (NO RED)                                           Fruit ices (not with fruit pulp, NO RED)                                     Popsicles (NO RED)                                                               Sports drinks like Gatorade (NO RED)                   The day of surgery:  Drink ONE (1) Pre-Surgery Clear Ensure by 1:00 PM the morning of surgery. Drink in one sitting. Do not sip.  This drink was given to you during your hospital  pre-op appointment visit. Nothing else to drink after completing the Pre-Surgery Clear Ensure.          If you have questions, please contact your surgeon's office.   FOLLOW ANY ADDITIONAL PRE OP INSTRUCTIONS YOU RECEIVED FROM YOUR SURGEON'S  OFFICE!!!     Oral Hygiene is also important to reduce your risk of infection.                                    Remember - BRUSH YOUR TEETH THE MORNING OF SURGERY WITH YOUR REGULAR TOOTHPASTE   Do NOT smoke after Midnight   Take these medicines the morning of surgery with A SIP OF WATER:  None  Stop all vitamins and herbal  supplements 7 days before surgery                              You may not have any metal on your body including hair pins, jewelry, and body piercing             Do not wear make-up, lotions, powders, perfume or deodorant              Men may shave face and neck.   Do not bring valuables to the hospital. [facility] IS NOT RESPONSIBLE   FOR VALUABLES.   Contacts, dentures or bridgework may not be worn into surgery.  DO NOT BRING YOUR HOME MEDICATIONS TO THE HOSPITAL. PHARMACY WILL DISPENSE MEDICATIONS LISTED ON YOUR MEDICATION LIST TO YOU DURING YOUR ADMISSION IN THE HOSPITAL!    Patients discharged on the day of surgery will not be allowed to drive home.  Someone NEEDS to stay with you for the first 24 hours after anesthesia.   Special Instructions: Bring a copy of your healthcare power of attorney and living will documents the day of surgery if you haven't scanned them before.              Please read over the following fact sheets you were given: IF YOU HAVE QUESTIONS ABOUT YOUR PRE-OP INSTRUCTIONS PLEASE CALL [phone] [facility]  If you received a COVID test during your pre-op visit  it is requested that you wear a mask when out in public, stay away from anyone that may not be feeling well and notify your surgeon if you develop symptoms. If you test positive for Covid or have been in contact with anyone that has tested positive in the last 10 days please notify you surgeon.   - Preparing for Surgery Before surgery, you can play an important role.  Because skin is not sterile, your skin needs to be as free of germs as possible.  You can reduce the  number of germs on your skin by washing with CHG (chlorahexidine gluconate) soap before surgery.  CHG is an antiseptic cleaner which kills germs and bonds with the skin to continue killing germs even after washing. Please DO NOT use if you have an allergy to CHG or antibacterial soaps.  If your skin becomes reddened/irritated stop using the CHG and inform your nurse when you arrive at Short Stay. Do not shave (including legs and underarms) for at least 48 hours prior to the first CHG shower.  You may shave your face/neck.  Please follow these instructions carefully:  1.  Shower with CHG Soap the night before surgery and the  morning of surgery.  2.  If you choose to wash your hair, wash your hair first as usual with your normal  shampoo.  3.  After you shampoo, rinse your hair and body thoroughly to remove the shampoo.                             4.  Use CHG as you would any other liquid soap.  You can apply chg directly to the skin and wash.  Gently with a scrungie or clean washcloth.  5.  Apply the CHG Soap to your body ONLY FROM THE NECK DOWN.   Do   not use on face/ open  Wound or open sores. Avoid contact with eyes, ears mouth and   genitals (private parts).                       Wash face,  Genitals (private parts) with your normal soap.             6.  Wash thoroughly, paying special attention to the area where your    surgery  will be performed.  7.  Thoroughly rinse your body with warm water from the neck down.  8.  DO NOT shower/wash with your normal soap after using and rinsing off the CHG Soap.                9.  Pat yourself dry with a clean towel.            10.  Wear clean pajamas.            11.  Place clean sheets on your bed the night of your first shower and do not  sleep with pets. Day of Surgery : Do not apply any lotions/deodorants the morning of surgery.  Please wear clean clothes to the hospital/surgery center.  FAILURE TO FOLLOW THESE  INSTRUCTIONS MAY RESULT IN THE CANCELLATION OF YOUR SURGERY  PATIENT SIGNATURE_________________________________  NURSE SIGNATURE__________________________________  ________________________________________________________________________    Rogelia Mire  An incentive spirometer is a tool that can help keep your lungs clear and active. This tool measures how well you are filling your lungs with each breath. Taking long deep breaths may help reverse or decrease the chance of developing breathing (pulmonary) problems (especially infection) following: A long period of time when you are unable to move or be active. BEFORE THE PROCEDURE  If the spirometer includes an indicator to show your best effort, your nurse or respiratory therapist will set it to a desired goal. If possible, sit up straight or lean slightly forward. Try not to slouch. Hold the incentive spirometer in an upright position. INSTRUCTIONS FOR USE  Sit on the edge of your bed if possible, or sit up as far as you can in bed or on a chair. Hold the incentive spirometer in an upright position. Breathe out normally. Place the mouthpiece in your mouth and seal your lips tightly around it. Breathe in slowly and as deeply as possible, raising the piston or the ball toward the top of the column. Hold your breath for 3-5 seconds or for as long as possible. Allow the piston or ball to fall to the bottom of the column. Remove the mouthpiece from your mouth and breathe out normally. Rest for a few seconds and repeat Steps 1 through 7 at least 10 times every 1-2 hours when you are awake. Take your time and take a few normal breaths between deep breaths. The spirometer may include an indicator to show your best effort. Use the indicator as a goal to work toward during each repetition. After each set of 10 deep breaths, practice coughing to be sure your lungs are clear. If you have an incision (the cut made at the time of surgery),  support your incision when coughing by placing a pillow or rolled up towels firmly against it. Once you are able to get out of bed, walk around indoors and cough well. You may stop using the incentive spirometer when instructed by your caregiver.  RISKS AND COMPLICATIONS Take your time so you do not get dizzy or light-headed. If you are in pain,  you may need to take or ask for pain medication before doing incentive spirometry. It is harder to take a deep breath if you are having pain. AFTER USE Rest and breathe slowly and easily. It can be helpful to keep track of a log of your progress. Your caregiver can provide you with a simple table to help with this. If you are using the spirometer at home, follow these instructions: SEEK MEDICAL CARE IF:  You are having difficultly using the spirometer. You have trouble using the spirometer as often as instructed. Your pain medication is not giving enough relief while using the spirometer. You develop fever of 100.5 F (38.1 C) or higher. SEEK IMMEDIATE MEDICAL CARE IF:  You cough up bloody sputum that had not been present before. You develop fever of 102 F (38.9 C) or greater. You develop worsening pain at or near the incision site. MAKE SURE YOU:  Understand these instructions. Will watch your condition. Will get help right away if you are not doing well or get worse. Document Released: 03/24/2007 Document Revised: 02/03/2012 Document Reviewed: 05/25/2007 [facility] Patient Information 2014 [place], [place].   ________________________________________________________________________

## 2023-08-12 NOTE — H&P (Signed)
TOTAL KNEE ADMISSION H&P  Patient is being admitted for right total knee arthroplasty.  Subjective:  Chief Complaint: Right knee pain.  HPI: Dawn Acevedo, 52 y.o. female has been evaluated in our office for chronic right knee pain. The patient indicates her knee has been bothering her for approximately 2 years now. She patient indicates she does a significant amount of walking in the spring and summer, which is when her pain flares up. She notes her knee swells as well, but it is not as severe as it was previously. The patient localizes her pain to the anterior aspect of her knee. She notes her knee felt mildly weak this morning, but it never buckled on her. She states she is able to squat, but it is significantly painful.   We obtained an MRI of the knee, which showed a meniscal tear. She presents today for arthroscopic treatment. Has failed previous treatment options including cortisone injection and activity modification.   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    Past Medical History:  Diagnosis Date   Anemia    Arthritis    RIGHT knee   History of vitamin D deficiency     Past Surgical History:  Procedure Laterality Date   DILITATION & CURRETTAGE/HYSTROSCOPY WITH NOVASURE ABLATION N/A 10/11/2014   Procedure: DILATATION & CURETTAGE/HYSTEROSCOPY WITH NOVASURE ABLATION;  Surgeon: Serita Kyle, MD;  Location: [facility];  Service: Gynecology;  Laterality: N/A;   LAPAROSCOPIC GASTRIC SLEEVE RESECTION     gastric sleeve   LAPAROSCOPIC TUBAL LIGATION Bilateral 10/11/2014   Procedure: LAPAROSCOPIC  BILATERAL TUBAL LIGATION with Bipolar Cautery;  Surgeon: Serita Kyle, MD;  Location: [facility];  Service: Gynecology;  Laterality: Bilateral;   WISDOM TOOTH EXTRACTION      Prior to Admission medications   Medication Sig Start Date End Date Taking? Authorizing Provider  Vitamin D, Ergocalciferol, (DRISDOL) 1.25 MG (50000 UNIT) CAPS capsule Take 1 capsule by mouth once a week 08/05/23   Milian, Aleen Campi, FNP    Allergies  Allergen Reactions   Folic Acid Hives    Social History   Socioeconomic History   Marital status: Single    Spouse name: Not on file   Number of children: 2   Years of education: Not on file   Highest education level: Some college, no degree  Occupational History   Not on file  Tobacco Use   Smoking status: Never   Smokeless tobacco: Never  Vaping Use   Vaping status: Never Used  Substance and Sexual Activity   Alcohol use: No    Comment: 1-2 glasses of wine per month   Drug use: No   Sexual activity: Not Currently    Birth control/protection: None  Other Topics Concern   Not on file  Social History Narrative   Not on file   Social Determinants of Health   Financial Resource Strain: Not on file  Food Insecurity: Not on file  Transportation Needs: Not on file  Physical Activity: Not on file  Stress: Not on file  Social Connections: Unknown (04/09/2022)   Received from [facility]   Social Network    Social Network: Not on file  Intimate Partner Violence: Unknown (03/01/2022)   Received from [facility]   HITS    Physically Hurt: Not on file    Insult or Talk Down To: Not on file    Threaten Physical Harm: Not on file    Scream or Curse: Not on file    Tobacco Use: Low Risk  (05/13/2023)   Patient History    Smoking Tobacco Use: Never    Smokeless Tobacco Use: Never    Passive Exposure: Not on file   Social History   Substance and Sexual Activity  Alcohol Use No   Comment: 1-2 glasses of  wine per month    Family History  Problem Relation Age of Onset   Heart disease Mother    Heart attack Mother    Hypertension Mother    Heart attack Father    Diabetes Father    Hypertension Father    Prostate cancer Father    Ovarian cancer Maternal Grandmother    Colon cancer Neg Hx    Stomach cancer Neg Hx    Esophageal cancer Neg Hx    Liver disease Neg Hx    Pancreatic cancer Neg Hx    Colon polyps Neg Hx    Rectal cancer Neg Hx     Review of Systems  Constitutional:  Negative for chills and fever.  HENT:  Negative for congestion, sore throat and tinnitus.   Eyes:  Negative for double vision, photophobia and pain.  Respiratory:  Negative for cough, shortness of breath and wheezing.   Cardiovascular:  Negative for chest pain, palpitations and orthopnea.  Gastrointestinal:  Negative for heartburn, nausea and vomiting.  Genitourinary:  Negative for dysuria, frequency and urgency.  Musculoskeletal:  Positive for joint pain.  Neurological:  Negative for dizziness, weakness and headaches.    Objective:  Physical Exam: The patient is a pleasant, well-developed female alert and oriented in no apparent distress.  Right Hip Exam: The range of motion: normal without discomfort.  Right Knee Exam: Trace effusion present. No swelling present. The range of motion is: 0 to 125 degrees. Positive crepitus on range of motion of the knee. Slight medial joint line tenderness. Slight lateral joint line tenderness. The knee is stable.  The patient's sensation and motor function are intact in their lower extremities. Their distal pulses are 2+. The bilateral calves are soft and non-tender.  Imaging Review  MRI of the right knee demonstrates patellofemoral arthritis. There is a posterior horn medial meniscal tear. There is some subchondral change on the distal aspect of the medial femoral condyle, but I do not see any full-thickness cartilage loss. The lateral compartment appears  normal. The ACL and PCL appear normal.  Assessment/Plan:  Right knee pain.    We discussed the patient's presenting complaints, history, and treatment options. She has a medial tear and has failed attempted conservative treatments. She presents today for arthroscopic debridement. Risks and benefits of the procedure were discussed previously with the patient and she elects to proceed.   Arther Abbott, PA-C Orthopedic Surgery [facility]

## 2023-08-13 ENCOUNTER — Inpatient Hospital Stay (HOSPITAL_COMMUNITY)
Admission: RE | Admit: 2023-08-13 | Discharge: 2023-08-13 | Disposition: A | Discharge status: Home / Self Care | Payer: PRIVATE HEALTH INSURANCE | Source: Ambulatory Visit

## 2023-08-13 ENCOUNTER — Other Ambulatory Visit (HOSPITAL_COMMUNITY): Payer: PRIVATE HEALTH INSURANCE

## 2023-08-13 NOTE — Progress Notes (Addendum)
Patient came in for her PAT appointment but states that she is canceling surgery because she is having severe heel pain and needs to have that evaluated first.  Scheduled to have heel evaluated 08-15-23.  Patient was advised to notify surgeon that she wants to cancel surgery.  Patient stated understanding.

## 2023-08-15 DIAGNOSIS — M25571 Pain in right ankle and joints of right foot: Secondary | ICD-10-CM | POA: Insufficient documentation

## 2023-08-18 ENCOUNTER — Encounter (HOSPITAL_COMMUNITY): Admission: RE | Payer: Self-pay | Source: Home / Self Care

## 2023-08-18 ENCOUNTER — Ambulatory Visit (HOSPITAL_COMMUNITY)
Admission: RE | Admit: 2023-08-18 | Payer: PRIVATE HEALTH INSURANCE | Source: Home / Self Care | Admitting: Orthopedic Surgery

## 2023-08-18 SURGERY — ARTHROSCOPY, KNEE
Anesthesia: Choice | Site: Knee | Laterality: Right

## 2023-08-19 ENCOUNTER — Encounter: Payer: Self-pay | Admitting: Family Medicine

## 2023-08-19 ENCOUNTER — Ambulatory Visit (INDEPENDENT_AMBULATORY_CARE_PROVIDER_SITE_OTHER): Payer: PRIVATE HEALTH INSURANCE | Admitting: Family Medicine

## 2023-08-19 VITALS — BP 137/73 | HR 63 | Temp 98.5°F | Ht 68.0 in | Wt 196.0 lb

## 2023-08-19 DIAGNOSIS — Z0000 Encounter for general adult medical examination without abnormal findings: Secondary | ICD-10-CM | POA: Diagnosis not present

## 2023-08-19 DIAGNOSIS — Z6829 Body mass index (BMI) 29.0-29.9, adult: Secondary | ICD-10-CM

## 2023-08-19 MED ORDER — WEGOVY 1.7 MG/0.75ML ~~LOC~~ SOAJ
1.7000 mg | SUBCUTANEOUS | 0 refills | Status: DC
Start: 2023-09-18 — End: 2024-03-02

## 2023-08-19 MED ORDER — SEMAGLUTIDE-WEIGHT MANAGEMENT 2.4 MG/0.75ML ~~LOC~~ SOAJ
2.4000 mg | SUBCUTANEOUS | 0 refills | Status: DC
Start: 2023-10-18 — End: 2024-03-02

## 2023-08-19 MED ORDER — WEGOVY 1 MG/0.5ML ~~LOC~~ SOAJ: 1.0000 mg | SUBCUTANEOUS | 0 refills | Status: DC

## 2023-08-19 NOTE — Patient Instructions (Signed)

## 2023-08-19 NOTE — Progress Notes (Signed)
Dawn Acevedo is a 52 y.o. female presents to office today for annual physical exam examination.    Concerns today include: 1. Restarting Wegovy for weight management  She has been off of wegovy for 2 weeks.  She was holding it for surgery, but they are postponing it due   Occupation: Admin/office  Marital status: none  Diet: states that she does not have a good diet. Usually eats chips, sodas, sandwich, salads. States that she does not take in a lot of water  Exercise: unable to due to foot issue  Substance use: none  Last eye exam: UTD  Last dental exam: UTD  Last colonoscopy: Completed 2022  Last mammogram: completed 2023 Last pap smear: Gyn  Contraceptive: post menopausal  Refills needed today: none other than wegovy  Other specialists seen: Gyn and ortho  Weight loss clinic  Dermatology exam: would like to wait  Fasting today:  no  Immunizations needed: Flu Vaccine: no  Tdap Vaccine: no  - every 87yrs - (<3 lifetime doses or unknown): all wounds -- look up need for Tetanus IG - (>=3 lifetime doses): clean/minor wound if >7yrs from previous; all other wounds if >70yrs from previous Zoster Vaccine: no (those >50yo, once) Pneumonia Vaccine: not due  (those w/ risk factors) - (<87yr) Both: Immunocompromised, cochlear implant, CSF leak, asplenic, sickle cell, Chronic Renal Failure - (<71yr) PPSV-23 only: Heart dz, lung disease, DM, tobacco abuse, alcoholism, cirrhosis/liver disease. - (>82yr): PPSV13 then PPSV23 in 6-12mths;  - (>67yr): repeat PPSV23 once if pt received prior to 52yo and 58yrs have passed   Past Medical History:  Diagnosis Date   Anemia    Arthritis    RIGHT knee   History of vitamin D deficiency    Social History   Socioeconomic History   Marital status: Single    Spouse name: Not on file   Number of children: 2   Years of education: Not on file   Highest education level: Some college, no degree  Occupational History   Not on file  Tobacco  Use   Smoking status: Never   Smokeless tobacco: Never  Vaping Use   Vaping status: Never Used  Substance and Sexual Activity   Alcohol use: No    Comment: 1-2 glasses of wine per month   Drug use: No   Sexual activity: Not Currently    Birth control/protection: None  Other Topics Concern   Not on file  Social History Narrative   Not on file   Social Determinants of Health   Financial Resource Strain: Not on file  Food Insecurity: Not on file  Transportation Needs: Not on file  Physical Activity: Not on file  Stress: Not on file  Social Connections: Unknown (04/09/2022)   Received from [facility], [facility]   Social Network    Social Network: Not on file  Intimate Partner Violence: Unknown (03/01/2022)   Received from [facility], [facility]   HITS    Physically Hurt: Not on file    Insult or Talk Down To: Not on file    Threaten Physical Harm: Not on file    Scream or Curse: Not on file   Past Surgical History:  Procedure Laterality Date   DILITATION & CURRETTAGE/HYSTROSCOPY WITH NOVASURE ABLATION N/A 10/11/2014   Procedure: DILATATION & CURETTAGE/HYSTEROSCOPY WITH NOVASURE ABLATION;  Surgeon: Serita Kyle, MD;  Location: [facility];  Service: Gynecology;  Laterality: N/A;   LAPAROSCOPIC GASTRIC SLEEVE RESECTION  gastric sleeve   LAPAROSCOPIC TUBAL LIGATION Bilateral 10/11/2014   Procedure: LAPAROSCOPIC BILATERAL TUBAL LIGATION with Bipolar Cautery;  Surgeon: Serita Kyle, MD;  Location: [facility];  Service: Gynecology;  Laterality: Bilateral;   WISDOM TOOTH EXTRACTION     Family History  Problem Relation Age of Onset   Heart disease Mother    Heart attack Mother    Hypertension Mother    Heart attack Father    Diabetes Father    Hypertension Father    Prostate cancer Father    Ovarian cancer Maternal Grandmother    Colon cancer Neg Hx    Stomach cancer Neg Hx    Esophageal cancer Neg Hx    Liver disease Neg Hx    Pancreatic cancer Neg  Hx    Colon polyps Neg Hx    Rectal cancer Neg Hx     Current Outpatient Medications:    meloxicam (MOBIC) 7.5 MG tablet, Take 7.5 mg by mouth 2 (two) times daily., Disp: , Rfl:    Vitamin D, Ergocalciferol, (DRISDOL) 1.25 MG (50000 UNIT) CAPS capsule, Take 1 capsule by mouth once a week, Disp: 12 capsule, Rfl: 0  Allergies  Allergen Reactions   Folic Acid Hives     ROS: Review of Systems Review of Systems  All other systems reviewed and are negative.   Physical exam    08/19/2023    3:39 PM 04/29/2023    3:56 PM 03/26/2023   10:31 AM  Vitals with BMI  Height 5\' 8"  5\' 8"  5\' 8"   Weight 196 lbs 206 lbs 198 lbs  BMI 29.81 31.33 30.11  Systolic 137 122 562  Diastolic 73 75 63  Pulse 63 49 55    Physical Exam Constitutional:      General: She is awake. She is not in acute distress.    Appearance: Normal appearance. She is well-developed and overweight. She is not ill-appearing, toxic-appearing or diaphoretic.  HENT:     Head:     Salivary Glands: Right salivary gland is not diffusely enlarged or tender. Left salivary gland is not diffusely enlarged or tender.     Right Ear: No decreased hearing noted. No laceration, drainage, swelling or tenderness. No middle ear effusion. There is no impacted cerumen. No foreign body. No mastoid tenderness. No PE tube. No hemotympanum. Tympanic membrane is not injected, scarred, perforated, erythematous, retracted or bulging.     Left Ear: No decreased hearing noted. No laceration, drainage, swelling or tenderness.  No middle ear effusion. There is no impacted cerumen. No foreign body. No mastoid tenderness. No PE tube. No hemotympanum. Tympanic membrane is not injected, scarred, perforated, erythematous, retracted or bulging.     Nose: No congestion or rhinorrhea.     Right Sinus: No maxillary sinus tenderness or frontal sinus tenderness.     Left Sinus: No maxillary sinus tenderness or frontal sinus tenderness.     Mouth/Throat:     Lips: Pink.  No lesions.     Mouth: No injury.     Dentition: Normal dentition.     Tongue: No lesions.     Pharynx: No pharyngeal swelling.     Tonsils: No tonsillar exudate or tonsillar abscesses.  Eyes:     General: Lids are normal.     Extraocular Movements: Extraocular movements intact.     Conjunctiva/sclera:     Right eye: Right conjunctiva is not injected. No chemosis, exudate or hemorrhage.    Left eye: Left conjunctiva is not injected. No  chemosis, exudate or hemorrhage. Neck:     Thyroid: No thyroid mass.     Trachea: Trachea and phonation normal.  Cardiovascular:     Rate and Rhythm: Normal rate and regular rhythm.     Pulses:          Radial pulses are 2+ on the right side and 2+ on the left side.       Posterior tibial pulses are 2+ on the right side and 2+ on the left side.     Heart sounds: Normal heart sounds.  Pulmonary:     Effort: Pulmonary effort is normal.     Breath sounds: Normal breath sounds. No stridor, decreased air movement or transmitted upper airway sounds. No decreased breath sounds, wheezing, rhonchi or rales.  Abdominal:     General: Abdomen is flat. Bowel sounds are normal.     Palpations: Abdomen is soft.     Tenderness: There is no abdominal tenderness.     Hernia: No hernia is present.  Genitourinary:    Comments: deferred Musculoskeletal:     Cervical back: Full passive range of motion without pain.     Right lower leg: No edema.     Left lower leg: No edema.       Feet:  Feet:     Comments: TTP on right medial ankle.  Decreased ROM due to pain Lymphadenopathy:     Cervical: No cervical adenopathy.     Right cervical: No superficial or deep cervical adenopathy.    Left cervical: No superficial or deep cervical adenopathy.  Neurological:     Mental Status: She is alert and easily aroused.  Psychiatric:        Behavior: Behavior is cooperative.     Assessment/ Plan: Dawn Acevedo here for annual physical exam.  1. Routine general medical  examination at a health care facility Discussed with patient to continue healthy lifestyle choices, including diet (rich in fruits, vegetables, and lean proteins, and low in salt and simple carbohydrates) and exercise (at least 30 minutes of moderate physical activity daily). Limit beverages high is sugar. Recommended at least 80-100 oz of water daily.  Patient to consider Shingles vaccine. Patient not fasting today. Will collect fasting labs at follow up.   2. BMI 29.0-29.9,adult Refills placed as below. Patient to follow up with weight loss clinic.  - Semaglutide-Weight Management (WEGOVY) 1 MG/0.5ML SOAJ; Inject 1 mg into the skin every 7 (seven) days. Month #3  Dispense: 2 mL; Refill: 0 - Semaglutide-Weight Management (WEGOVY) 1.7 MG/0.75ML SOAJ; Inject 1.7 mg into the skin once a week.  Dispense: 3 mL; Refill: 0 - Semaglutide-Weight Management 2.4 MG/0.75ML SOAJ; Inject 2.4 mg into the skin once a week.  Dispense: 3 mL; Refill: 0  Patient to follow up in 1 year for annual exam or sooner if needed.  The above assessment and management plan was discussed with the patient. The patient verbalized understanding of and has agreed to the management plan. Patient is aware to call the clinic if symptoms persist or worsen. Patient is aware when to return to the clinic for a follow-up visit. Patient educated on when it is appropriate to go to the emergency department.   Neale Burly, DNP-FNP [facility] [address] [place], [place] [address] [phone]

## 2023-08-21 ENCOUNTER — Encounter: Payer: PRIVATE HEALTH INSURANCE | Admitting: Nurse Practitioner

## 2023-09-17 ENCOUNTER — Other Ambulatory Visit: Payer: Self-pay | Admitting: Family Medicine

## 2023-09-17 DIAGNOSIS — Z6829 Body mass index (BMI) 29.0-29.9, adult: Secondary | ICD-10-CM

## 2023-09-25 ENCOUNTER — Other Ambulatory Visit: Payer: Self-pay | Admitting: *Deleted

## 2023-09-25 DIAGNOSIS — Z6829 Body mass index (BMI) 29.0-29.9, adult: Secondary | ICD-10-CM

## 2023-10-02 ENCOUNTER — Other Ambulatory Visit: Payer: Self-pay | Admitting: Family Medicine

## 2023-10-02 DIAGNOSIS — Z6829 Body mass index (BMI) 29.0-29.9, adult: Secondary | ICD-10-CM

## 2023-10-06 ENCOUNTER — Ambulatory Visit (INDEPENDENT_AMBULATORY_CARE_PROVIDER_SITE_OTHER): Payer: PRIVATE HEALTH INSURANCE | Admitting: Nurse Practitioner

## 2023-10-06 ENCOUNTER — Encounter: Payer: Self-pay | Admitting: Nurse Practitioner

## 2023-10-06 VITALS — BP 106/65 | HR 92 | Temp 98.1°F | Ht 68.0 in | Wt 186.8 lb

## 2023-10-06 DIAGNOSIS — N3001 Acute cystitis with hematuria: Secondary | ICD-10-CM | POA: Insufficient documentation

## 2023-10-06 DIAGNOSIS — R300 Dysuria: Secondary | ICD-10-CM | POA: Diagnosis not present

## 2023-10-06 LAB — URINALYSIS, ROUTINE W REFLEX MICROSCOPIC
Bilirubin, UA: NEGATIVE
Glucose, UA: NEGATIVE
Nitrite, UA: POSITIVE — AB
Specific Gravity, UA: >1.03 — ABNORMAL HIGH (ref 1.005–1.030)
Urobilinogen, Ur: 0.2 mg/dL (ref 0.2–1.0)
pH, UA: 6 (ref 5.0–7.5)

## 2023-10-06 LAB — MICROSCOPIC EXAMINATION
Epithelial Cells (non renal): NONE SEEN /[HPF] (ref 0–10)
Renal Epithel, UA: NONE SEEN /[HPF]
WBC, UA: >30 /[HPF] — AB (ref 0–5)
Yeast, UA: NONE SEEN

## 2023-10-06 MED ORDER — DOXYCYCLINE HYCLATE 100 MG PO CAPS
100.0000 mg | ORAL_CAPSULE | Freq: Two times a day (BID) | ORAL | 0 refills | Status: DC
Start: 2023-10-06 — End: 2024-02-14

## 2023-10-06 NOTE — Progress Notes (Signed)
Acute Office Visit  Subjective:     Patient ID: Dawn Acevedo, female    DOB: January 16, 1971, 52 y.o.   MRN: [id number]  Chief Complaint  Patient presents with   Dysuria    Went to urgent care last week treated for uti with cefdinir but once she was finished symptoms came back    HPI  Dawn Acevedo is a 52 y.o. female present on 10/06/2023 fro an acute visit concerns for UTI.She was seen at an urgent care  09/15/2023 and prescribed Cefdinir fro 7-days which she reports she completed the course of treatment. The symptoms came a few days later. She c/o  of urinary frequency, urgency and dysuria x 4 days, without flank pain, fever, chills, or abnormal vaginal discharge or bleeding.   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  HENT:  Negative for hearing loss.   Respiratory:  Negative for cough and shortness of breath.   Cardiovascular:  Negative for chest pain and leg swelling.  Gastrointestinal:  Negative for constipation, nausea and vomiting.  Genitourinary:  Positive for dysuria, frequency and urgency. Negative for flank pain and hematuria.  Musculoskeletal:  Negative for falls and myalgias.  Skin:  Negative for itching and rash.  Neurological:  Negative for dizziness and headaches.   Endo/Heme/Allergies:  Negative for environmental allergies and polydipsia. Does not bruise/bleed easily.  Psychiatric/Behavioral:  Positive for suicidal ideas. The patient does not have insomnia.    Negative unless indicated in HPI    Objective:    BP 106/65   Pulse 92   Temp 98.1 F (36.7 C) (Temporal)   Ht 5\' 8"  (1.727 m)   Wt 186 lb 12.8 oz (84.7 kg)   SpO2 100%   BMI 28.40 kg/m  BP Readings from Last 3 Encounters:  10/06/23 106/65  08/19/23 137/73  04/29/23 122/75   Wt Readings from Last 3 Encounters:  10/06/23 186 lb 12.8 oz (84.7 kg)  08/19/23 196 lb (88.9 kg)  04/29/23 206 lb (93.4 kg)      Physical Exam Vitals and nursing note reviewed.  Constitutional:      Appearance: Normal appearance. She is overweight.  HENT:     Head: Normocephalic and atraumatic.  Eyes:     Extraocular Movements: Extraocular movements intact.     Conjunctiva/sclera: Conjunctivae normal.     Pupils: Pupils are equal, round, and reactive to light.  Cardiovascular:     Rate and Rhythm: Normal rate and regular rhythm.  Pulmonary:     Effort: Pulmonary effort is normal.     Breath sounds: Normal breath sounds.  Abdominal:     General: There is no distension.     Palpations: There is no mass.     Tenderness: There is  no abdominal tenderness. There is no right CVA tenderness, left CVA tenderness, guarding or rebound.     Hernia: No hernia is present.  Musculoskeletal:        General: Normal range of motion.     Cervical back: Normal range of motion.     Right lower leg: No edema.     Left lower leg: No edema.  Skin:    General: Skin is warm and dry.     Findings: No rash.  Neurological:     Mental Status: She is alert and oriented to person, place, and time. Mental status is at baseline.  Psychiatric:        Mood and Affect: Mood normal.        Behavior: Behavior normal.        Thought Content: Thought content normal.        Judgment: Judgment normal.   . No CVA tenderness or  inguinal adenopathy noted. Urine dipstick shows positive for WBC's, positive for protein, positive for nitrates, positive for leukocytes, and positive for ketones.  Micro exam: >30 WBC's per HPF, 0-2 RBC's per HPF, and few+ bacteria.  No results found for any visits on 10/06/23.      Assessment & Plan:  Dysuria -     Urinalysis, Routine w reflex microscopic -     Urine Culture -     Doxycycline Hyclate; Take 1 capsule (100 mg total) by mouth 2 (two) times daily.  Dispense: 20 capsule; Refill: 0  Acute cystitis with hematuria -     Doxycycline Hyclate; Take 1 capsule (100 mg total) by mouth 2 (two) times daily.  Dispense: 20 capsule; Refill: 0  Dawn Acevedo is a 52 yrs old [place] female, no acute distress Acute cystitis:will we will treat with broad-spectrum antibiotic while waiting for culture results.  Client understand based on culture a new antibiotic may be when needed. Doxy 100 mg #20 dispense Increase hydration, void postcoital may use Pyridium OTC prn. Call or return to clinic prn if these symptoms worsen or fail to improve as anticipated.   Encourage healthy lifestyle choices, including diet (rich in fruits, vegetables, and lean proteins, and low in salt and simple carbohydrates) and exercise (at least 30 minutes of moderate physical activity daily).     The above assessment and management plan was discussed with the patient. The patient verbalized understanding of and has agreed to the management plan. Patient is aware to call the clinic if they develop any new symptoms or if symptoms persist or worsen. Patient is aware when to return to the clinic for a follow-up visit. Patient educated on when it is appropriate to go to the emergency department.  Return if symptoms worsen or fail to improve.  Arrie Aran Santa Lighter, DNP [facility] [address] [place], [place] [address] [phone]

## 2023-10-07 ENCOUNTER — Encounter: Payer: PRIVATE HEALTH INSURANCE | Admitting: Family Medicine

## 2023-10-09 LAB — URINE CULTURE

## 2023-10-26 NOTE — Progress Notes (Unsigned)
Cardiology Office Note   Date:  10/26/2023   ID:  Dawn Acevedo, DOB 05/11/1971, MRN [id number]  PCP:  Arrie Senate, FNP  Cardiologist:   None Referring:  ***  No chief complaint on file.     History of Present Illness: Dawn Acevedo is a 52 y.o. female who presents for evaluation of bradycardia.  ***     Past Medical History:  Diagnosis Date   Anemia    Arthritis    RIGHT knee   History of vitamin D deficiency     Past Surgical History:  Procedure Laterality Date   DILITATION & CURRETTAGE/HYSTROSCOPY WITH NOVASURE ABLATION N/A 10/11/2014   Procedure: DILATATION & CURETTAGE/HYSTEROSCOPY WITH NOVASURE ABLATION;  Surgeon: Serita Kyle, MD;  Location: [facility];  Service: Gynecology;  Laterality: N/A;   LAPAROSCOPIC GASTRIC SLEEVE RESECTION     gastric sleeve   LAPAROSCOPIC TUBAL LIGATION Bilateral 10/11/2014   Procedure: LAPAROSCOPIC BILATERAL TUBAL LIGATION with Bipolar Cautery;  Surgeon: Serita Kyle, MD;  Location: [facility];  Service: Gynecology;  Laterality: Bilateral;   WISDOM TOOTH EXTRACTION       Current Outpatient Medications  Medication Sig Dispense Refill   doxycycline (VIBRAMYCIN) 100 MG capsule Take 1 capsule (100 mg total) by mouth 2 (two) times daily. 20 capsule 0   meloxicam (MOBIC) 7.5 MG tablet Take 7.5 mg by mouth 2 (two) times daily.     Semaglutide-Weight Management (WEGOVY) 1 MG/0.5ML SOAJ Inject 1 mg into the skin every 7 (seven) days. Month #3 2 mL 0   Semaglutide-Weight Management (WEGOVY) 1.7 MG/0.75ML SOAJ Inject 1.7 mg into the skin once a week. 3 mL 0   Semaglutide-Weight Management 2.4 MG/0.75ML SOAJ Inject 2.4 mg into the skin once a week. 3 mL 0   Vitamin D, Ergocalciferol, (DRISDOL) 1.25 MG (50000 UNIT) CAPS capsule Take 1 capsule by mouth once a week 12 capsule 0   No current facility-administered medications for this visit.    Allergies:   Folic acid    Social History:  The patient  reports that she  has never smoked. She has never used smokeless tobacco. She reports that she does not drink alcohol and does not use drugs.   Family History:  The patient's ***family history includes Diabetes in her father; Heart attack in her father and mother; Heart disease in her mother; Hypertension in her father and mother; Ovarian cancer in her maternal grandmother; Prostate cancer in her father.    ROS:  Please see the history of present illness.   Otherwise, review of systems are positive for {NONE DEFAULTED:18576}.   All other systems are reviewed and negative.    PHYSICAL EXAM: VS:  There were no vitals taken for this visit. , BMI There is no height or weight on file to calculate BMI. GENERAL:  Well appearing HEENT:  Pupils equal round and reactive, fundi not visualized, oral mucosa unremarkable NECK:  No jugular venous distention, waveform within normal limits, carotid upstroke brisk and symmetric, no bruits, no thyromegaly LYMPHATICS:  No cervical, inguinal adenopathy LUNGS:  Clear to auscultation bilaterally BACK:  No CVA tenderness CHEST:  Unremarkable HEART:  PMI not displaced or sustained,S1 and S2 within normal limits, no S3, no S4, no clicks, no rubs, *** murmurs ABD:  Flat, positive bowel sounds normal in frequency in pitch, no bruits, no rebound, no guarding, no midline pulsatile mass, no hepatomegaly, no splenomegaly EXT:  2 plus pulses throughout, no edema, no cyanosis no clubbing  SKIN:  No rashes no nodules NEURO:  Cranial nerves II through XII grossly intact, motor grossly intact throughout PSYCH:  Cognitively intact, oriented to person place and time    EKG:        Recent Labs: 03/04/2023: ALT 43; BUN 9; Creatinine, Ser 0.82; Hemoglobin 13.0; Platelets 258; Potassium 4.7; Sodium 139 03/26/2023: Magnesium 2.1    Lipid Panel    Component Value Date/Time   CHOL 130 11/05/2022 1640   TRIG 43 11/05/2022 1640   HDL 70 11/05/2022 1640   CHOLHDL 1.9 11/05/2022 1640   LDLCALC 49  11/05/2022 1640      Wt Readings from Last 3 Encounters:  10/06/23 186 lb 12.8 oz (84.7 kg)  08/19/23 196 lb (88.9 kg)  04/29/23 206 lb (93.4 kg)      Other studies Reviewed: Additional studies/ records that were reviewed today include: ***. Review of the above records demonstrates:  Please see elsewhere in the note.  ***   ASSESSMENT AND PLAN:  Bradycardia:  ***   Current medicines are reviewed at length with the patient today.  The patient {ACTIONS; HAS/DOES NOT HAVE:19233} concerns regarding medicines.  The following changes have been made:  {PLAN; NO CHANGE:13088:s}  Labs/ tests ordered today include: *** No orders of the defined types were placed in this encounter.    Disposition:   FU with ***    Signed, Rollene Rotunda, MD  10/26/2023 3:23 PM    [facility]

## 2023-10-29 ENCOUNTER — Ambulatory Visit: Payer: PRIVATE HEALTH INSURANCE | Admitting: Cardiology

## 2023-10-29 DIAGNOSIS — R001 Bradycardia, unspecified: Secondary | ICD-10-CM

## 2023-11-05 ENCOUNTER — Ambulatory Visit: Payer: PRIVATE HEALTH INSURANCE | Admitting: Podiatry

## 2023-11-12 ENCOUNTER — Ambulatory Visit: Payer: PRIVATE HEALTH INSURANCE | Admitting: Podiatry

## 2024-02-13 ENCOUNTER — Emergency Department (HOSPITAL_COMMUNITY)
Admission: EM | Admit: 2024-02-13 | Discharge: 2024-02-14 | Disposition: A | Discharge status: Home / Self Care | Payer: PRIVATE HEALTH INSURANCE | Attending: Emergency Medicine | Admitting: Emergency Medicine

## 2024-02-13 DIAGNOSIS — S299XXA Unspecified injury of thorax, initial encounter: Secondary | ICD-10-CM | POA: Diagnosis not present

## 2024-02-13 DIAGNOSIS — S8391XA Sprain of unspecified site of right knee, initial encounter: Secondary | ICD-10-CM | POA: Diagnosis not present

## 2024-02-13 DIAGNOSIS — M62838 Other muscle spasm: Secondary | ICD-10-CM | POA: Insufficient documentation

## 2024-02-13 DIAGNOSIS — Z794 Long term (current) use of insulin: Secondary | ICD-10-CM | POA: Insufficient documentation

## 2024-02-13 DIAGNOSIS — S298XXA Other specified injuries of thorax, initial encounter: Secondary | ICD-10-CM

## 2024-02-13 DIAGNOSIS — Y92410 Unspecified street and highway as the place of occurrence of the external cause: Secondary | ICD-10-CM | POA: Insufficient documentation

## 2024-02-13 DIAGNOSIS — S161XXA Strain of muscle, fascia and tendon at neck level, initial encounter: Secondary | ICD-10-CM | POA: Insufficient documentation

## 2024-02-13 DIAGNOSIS — S199XXA Unspecified injury of neck, initial encounter: Secondary | ICD-10-CM | POA: Diagnosis present

## 2024-02-14 ENCOUNTER — Other Ambulatory Visit: Payer: Self-pay

## 2024-02-14 ENCOUNTER — Emergency Department (HOSPITAL_COMMUNITY): Payer: PRIVATE HEALTH INSURANCE

## 2024-02-14 ENCOUNTER — Encounter (HOSPITAL_COMMUNITY): Payer: Self-pay | Admitting: Emergency Medicine

## 2024-02-14 MED ORDER — METHOCARBAMOL 500 MG PO TABS: 500.0000 mg | ORAL_TABLET | Freq: Two times a day (BID) | ORAL | 0 refills | Status: DC
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
## 2024-02-14 NOTE — Discharge Instructions (Signed)
You have neck pain, possibly from a cervical strain and/or pinched nerve.  ° °SEEK IMMEDIATE MEDICAL ATTENTION IF: °You develop difficulties swallowing or breathing.  °You have new or worse numbness, weakness, tingling, or movement problems in your arms or legs.  °You develop increasing pain which is uncontrolled with medications.  °You have change in bowel or bladder function, or other concerns. ° ° ° °

## 2024-02-14 NOTE — ED Notes (Signed)
 C-collar placed.
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
## 2024-02-18 ENCOUNTER — Encounter: Payer: Self-pay | Admitting: Family Medicine

## 2024-02-18 ENCOUNTER — Ambulatory Visit (INDEPENDENT_AMBULATORY_CARE_PROVIDER_SITE_OTHER): Payer: PRIVATE HEALTH INSURANCE | Admitting: Family Medicine

## 2024-02-18 DIAGNOSIS — M542 Cervicalgia: Secondary | ICD-10-CM

## 2024-02-18 DIAGNOSIS — M25511 Pain in right shoulder: Secondary | ICD-10-CM

## 2024-02-18 DIAGNOSIS — M25561 Pain in right knee: Secondary | ICD-10-CM | POA: Diagnosis not present

## 2024-02-18 MED ORDER — METHYLPREDNISOLONE ACETATE 80 MG/ML IJ SUSP
80.0000 mg | Freq: Once | INTRAMUSCULAR | Status: AC
  Administered 2024-02-18: 80 mg via INTRAMUSCULAR
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
## 2024-02-20 ENCOUNTER — Telehealth: Payer: Self-pay | Admitting: Family Medicine

## 2024-02-20 DIAGNOSIS — Z0279 Encounter for issue of other medical certificate: Secondary | ICD-10-CM

## 2024-02-20 NOTE — Telephone Encounter (Signed)
 Dawn Acevedo dropped off Disability forms to be completed and signed.  Form Fee Paid? (Y/N)       yes     If NO, form is placed on front office manager desk to hold until payment received. If YES, then form will be placed in the RX/HH Nurse Coordinators box for completion.  Form will not be processed until payment is received   Please call, when forms are ready. She wants copy & she will pick-up original copy to send in.

## 2024-02-26 NOTE — Telephone Encounter (Signed)
 PCP completed and signed leave forms. They have been faxed to [employer] at fax number [phone]. Patient has been contacted and informed they are complete. Copy at front desk.

## 2024-03-02 ENCOUNTER — Ambulatory Visit (INDEPENDENT_AMBULATORY_CARE_PROVIDER_SITE_OTHER): Payer: PRIVATE HEALTH INSURANCE | Admitting: Family Medicine

## 2024-03-02 ENCOUNTER — Encounter: Payer: Self-pay | Admitting: Family Medicine

## 2024-03-02 VITALS — BP 112/73 | HR 50 | Temp 98.3°F | Ht 68.0 in | Wt 201.0 lb

## 2024-03-02 DIAGNOSIS — R400 Somnolence: Secondary | ICD-10-CM | POA: Diagnosis not present

## 2024-03-02 DIAGNOSIS — K219 Gastro-esophageal reflux disease without esophagitis: Secondary | ICD-10-CM

## 2024-03-02 MED ORDER — FAMOTIDINE 20 MG PO TABS
20.0000 mg | ORAL_TABLET | Freq: Two times a day (BID) | ORAL | 0 refills | Status: DC
Start: 2024-03-02 — End: 2024-06-24

## 2024-03-02 NOTE — Progress Notes (Signed)
 Subjective:  Patient ID: Dawn Acevedo, female    DOB: 04/02/1971, 53 y.o.   MRN: [id number]  Patient Care Team: Arrie Senate, FNP as PCP - General (Family Medicine)   Chief Complaint:  Gastroesophageal Reflux   HPI: Dawn Acevedo is a 53 y.o. female presenting on 03/02/2024 for Gastroesophageal Reflux States that last night as she was sleeping she woke up "strangling". States that she had mucus coming out of her nose, coughing, could not catch her breath. Reports that she had burning sensation in her throat and neck. States that she coughed for 3-4 minutes, then had sharp stinging in throat and nose. States that she has had episodes like this in the past, but this is the worst. Denies cough, congestion, allergy symptoms otherwise. She has history of gastric surgery. States that this morning she was still hoarse. Does not endorse any blood  States that son and friend says she snores, she admits to daytime fatigue.   Relevant past medical, surgical, family, and social history reviewed and updated as indicated.  Allergies and medications reviewed and updated. Data reviewed: Chart in Epic.   Past Medical History:  Diagnosis Date   Anemia    Arthritis    RIGHT knee   History of vitamin D deficiency     Past Surgical History:  Procedure Laterality Date   DILITATION & CURRETTAGE/HYSTROSCOPY WITH NOVASURE ABLATION N/A 10/11/2014   Procedure: DILATATION & CURETTAGE/HYSTEROSCOPY WITH NOVASURE ABLATION;  Surgeon: Serita Kyle, MD;  Location: [facility];  Service: Gynecology;  Laterality: N/A;   LAPAROSCOPIC GASTRIC SLEEVE RESECTION     gastric sleeve   LAPAROSCOPIC TUBAL LIGATION Bilateral 10/11/2014   Procedure: LAPAROSCOPIC BILATERAL TUBAL LIGATION with Bipolar Cautery;  Surgeon: Serita Kyle, MD;  Location: [facility];  Service: Gynecology;  Laterality: Bilateral;   WISDOM TOOTH EXTRACTION      Social History   Socioeconomic History   Marital status:  Single    Spouse name: Not on file   Number of children: 2   Years of education: Not on file   Highest education level: Some college, no degree  Occupational History   Not on file  Tobacco Use   Smoking status: Never   Smokeless tobacco: Never  Vaping Use   Vaping status: Never Used  Substance and Sexual Activity   Alcohol use: No    Comment: 1-2 glasses of wine per month   Drug use: No   Sexual activity: Not Currently    Birth control/protection: None  Other Topics Concern   Not on file  Social History Narrative   Not on file   Social Drivers of Health   Financial Resource Strain: Not on file  Food Insecurity: Not on file  Transportation Needs: Not on file  Physical Activity: Not on file  Stress: Not on file  Social Connections: Unknown (04/09/2022)   Received [facility]   Social Network    Social Network: Not on file  Intimate Partner Violence: Unknown (03/01/2022)   [facility] from [facility]   HITS    Physically Hurt: Not on file    Insult or Talk Down To: Not on file    Threaten Physical Harm: Not on file    Scream or Curse: Not on file    Outpatient Encounter Medications as of 03/02/2024  Medication Sig   methocarbamol (ROBAXIN) 500 MG tablet Take 1 tablet (500 mg total) by mouth 2 (two) times daily.  Semaglutide-Weight Management (WEGOVY) 1 MG/0.5ML SOAJ Inject 1 mg into the skin every 7 (seven) days. Month #3   Semaglutide-Weight Management (WEGOVY) 1.7 MG/0.75ML SOAJ Inject 1.7 mg into the skin once a week.   Semaglutide-Weight Management 2.4 MG/0.75ML SOAJ Inject 2.4 mg into the skin once a week.   Vitamin D, Ergocalciferol, (DRISDOL) 1.25 MG (50000 UNIT) CAPS capsule Take 1 capsule by mouth once a week   ZEPBOUND 2.5 MG/0.5ML Pen SMARTSIG:2.5 Milligram(s) SUB-Q Once a Week   No facility-administered encounter medications on file as of 03/02/2024.    Allergies  Allergen Reactions   Folic Acid Hives   Review of  Systems As per HPI  Objective:  BP 112/73   Pulse (!) 50   Temp 98.3 F (36.8 C)   Ht 5\' 8"  (1.727 m)   Wt 201 lb (91.2 kg)   SpO2 100%   BMI 30.56 kg/m    Wt Readings from Last 3 Encounters:  03/02/24 201 lb (91.2 kg)  02/18/24 206 lb (93.4 kg)  02/14/24 186 lb 11.7 oz (84.7 kg)    Physical Exam Constitutional:      General: She is awake. She is not in acute distress.    Appearance: Normal appearance. She is well-developed and well-groomed. She is obese. She is not ill-appearing, toxic-appearing or diaphoretic.  Cardiovascular:     Rate and Rhythm: Regular rhythm. Bradycardia present.     Pulses: Normal pulses.          Radial pulses are 2+ on the right side and 2+ on the left side.       Posterior tibial pulses are 2+ on the right side and 2+ on the left side.     Heart sounds: Normal heart sounds. No murmur heard.    No gallop.  Pulmonary:     Effort: Pulmonary effort is normal. No respiratory distress.     Breath sounds: Normal breath sounds. No stridor. No wheezing, rhonchi or rales.  Musculoskeletal:     Cervical back: Full passive range of motion without pain and neck supple.     Right lower leg: No edema.     Left lower leg: No edema.  Skin:    General: Skin is warm.     Capillary Refill: Capillary refill takes less than 2 seconds.  Neurological:     General: No focal deficit present.     Mental Status: She is alert, oriented to person, place, and time and easily aroused. Mental status is at baseline.     GCS: GCS eye subscore is 4. GCS verbal subscore is 5. GCS motor subscore is 6.     Motor: No weakness.  Psychiatric:        Attention and Perception: Attention and perception normal.        Mood and Affect: Mood and affect normal.        Speech: Speech normal.        Behavior: Behavior normal. Behavior is cooperative.        Thought Content: Thought content normal. Thought content does not include homicidal or suicidal ideation. Thought content does not  include homicidal or suicidal plan.        Cognition and Memory: Cognition and memory normal.        Judgment: Judgment normal.     Results for orders placed or performed in visit on 10/06/23  Urine Culture   Collection Time: 10/06/23  8:53 AM   Specimen: Urine   UR  Result Value Ref  Range   Urine Culture, Routine Final report (A)    Organism ID, Bacteria Comment (A)    Antimicrobial Susceptibility Comment   Microscopic Examination   Collection Time: 10/06/23  8:53 AM   Urine  Result Value Ref Range   WBC, UA >30 (A) 0 - 5 /hpf   RBC, Urine 0-2 0 - 2 /hpf   Epithelial Cells (non renal) None seen 0 - 10 /hpf   Renal Epithel, UA None seen None seen /hpf   Bacteria, UA Few (A) None seen/Few   Yeast, UA None seen None seen  Urinalysis, Routine w reflex microscopic   Collection Time: 10/06/23  8:53 AM  Result Value Ref Range   Specific Gravity, UA >1.030 (H) 1.005 - 1.030   pH, UA 6.0 5.0 - 7.5   Color, UA Yellow Yellow   Appearance Ur Clear Clear   Leukocytes,UA 3+ (A) Negative   Protein,UA 1+ (A) Negative/Trace   Glucose, UA Negative Negative   Ketones, UA Trace (A) Negative   RBC, UA 2+ (A) Negative   Bilirubin, UA Negative Negative   Urobilinogen, Ur 0.2 0.2 - 1.0 mg/dL   Nitrite, UA Positive (A) Negative   Microscopic Examination See below:        03/02/2024   10:29 AM 02/18/2024    9:35 AM 02/18/2024    9:34 AM 08/19/2023    3:37 PM 04/29/2023    3:59 PM  Depression screen PHQ 2/9  Decreased Interest 0 0 0 0 0  Down, Depressed, Hopeless 0 0 0 0 0  PHQ - 2 Score 0 0 0 0 0  Altered sleeping  0  0 0  Tired, decreased energy  0  0 0  Change in appetite  0  0 0  Feeling bad or failure about yourself   0  0 0  Trouble concentrating  0  0 0  Moving slowly or fidgety/restless  1  0 0  Suicidal thoughts  0  0 0  PHQ-9 Score  1  0 0  Difficult doing work/chores  Not difficult at all  Not difficult at all Not difficult at all       02/18/2024    9:34 AM 08/19/2023     3:37 PM 04/29/2023    3:59 PM 03/04/2023    4:50 PM  GAD 7 : Generalized Anxiety Score  Nervous, Anxious, on Edge 0 0 0 0  Control/stop worrying 0 0 0 0  Worry too much - different things 0 0 0 0  Trouble relaxing 0 0 0 0  Restless 0 0 0 0  Easily annoyed or irritable 1 0 0 0  Afraid - awful might happen 0 0 0 0  Total GAD 7 Score 1 0 0 0  Anxiety Difficulty  Not difficult at all Not difficult at all Not difficult at all    Pertinent labs & imaging results that were available during my care of the patient were reviewed by me and considered in my medical decision making.  Assessment & Plan:  Samirah was seen today for gastroesophageal reflux.  Diagnoses and all orders for this visit:  Gastroesophageal reflux disease without esophagitis Will start medication as below for 8 week trial. Discussed lifestyle modifications with patient. Discussed red flag symptoms. Will evaluate for sleep apnea as below.  -     famotidine (PEPCID) 20 MG tablet; Take 1 tablet (20 mg total) by mouth 2 (two) times daily.  Has daytime drowsiness -  Ambulatory referral to Sleep Studies     Continue all other maintenance medications.  Follow up plan: Return if symptoms worsen or fail to improve.   Continue healthy lifestyle choices, including diet (rich in fruits, vegetables, and lean proteins, and low in salt and simple carbohydrates) and exercise (at least 30 minutes of moderate physical activity daily).  Written and verbal instructions provided   The above assessment and management plan was discussed with the patient. The patient verbalized understanding of and has agreed to the management plan. Patient is aware to call the clinic if they develop any new symptoms or if symptoms persist or worsen. Patient is aware when to return to the clinic for a follow-up visit. Patient educated on when it is appropriate to go to the emergency department.   Neale Burly, DNP-FNP [facility]  Medicine [address] [place], [place] [address] [phone]

## 2024-04-26 ENCOUNTER — Institutional Professional Consult (permissible substitution): Payer: PRIVATE HEALTH INSURANCE | Admitting: Neurology

## 2024-05-20 ENCOUNTER — Ambulatory Visit: Payer: Self-pay

## 2024-05-20 NOTE — Telephone Encounter (Signed)
 FYI Only or Action Required?: Action required by provider: medication refill request.  Patient was last seen in primary care on 03/02/2024 by Cathlene Marry Lenis, FNP. Called Nurse Triage reporting Rash. Symptoms began several days ago. Interventions attempted: Other: cornstarch . Symptoms are: gradually worsening.  Triage Disposition: See HCP Within 4 Hours (Or PCP Triage)  Patient/caregiver understands and will follow disposition?: Yes  Copied from CRM [id number]. Topic: Clinical - Red Word Triage >> May 20, 2024  4:06 PM Powell HERO wrote: Red Word that prompted transfer to Nurse Triage: Patient is wanting cream for a possible infection under her belly, states she's has it before it smells and there is pus Reason for Disposition  [1] Looks infected (spreading redness, pus) AND [2] large red area (> 2 in. or 5 cm)  Answer Assessment - Initial Assessment Questions , pt requesting cream from PCP bc pt states she has had some cream for this before. No appmt availability in office.  Pt advised to go to UC, pt states she will figure out.   1. APPEARANCE of RASH: Describe the rash.      Lower abdomen under fold straight across 2. LOCATION: Where is the rash located?      L side is raw - odor & pus R side - red 3. NUMBER: How many spots are there?      One large area 5. ONSET: When did the rash start?      Begin of this week  6. ITCHING: Does the rash itch? If Yes, ask: How bad is the itch?  (Scale 0-10; or none, mild, moderate, severe)     itching 7. PAIN: Does the rash hurt? If Yes, ask: How bad is the pain?  (Scale 0-10; or none, mild, moderate, severe)    - NONE (0): no pain    - MILD (1-3): doesn't interfere with normal activities     - MODERATE (4-7): interferes with normal activities or awakens from sleep     - SEVERE (8-10): excruciating pain, unable to do any normal activities     Some burn where is raw 8. OTHER SYMPTOMS: Do you have any other symptoms? (e.g.,  fever)     Pt reports odor to L side, possibly pus, no fever. No bumps, d/c from raw skin region. No bloody d/c Pt reports using Cornstarch with minimal to no relief  - itchy,  Protocols used: Rash or Redness - Localized-A-AH

## 2024-05-21 NOTE — Telephone Encounter (Signed)
 Advised to go to UC. LS

## 2024-06-23 ENCOUNTER — Ambulatory Visit: Payer: PRIVATE HEALTH INSURANCE | Admitting: Family Medicine

## 2024-06-24 ENCOUNTER — Ambulatory Visit: Payer: PRIVATE HEALTH INSURANCE | Admitting: Family Medicine

## 2024-06-24 ENCOUNTER — Encounter: Payer: Self-pay | Admitting: Family Medicine

## 2024-06-24 VITALS — BP 130/73 | HR 58 | Temp 97.9°F | Resp 18 | Ht 68.0 in | Wt 190.1 lb

## 2024-06-24 DIAGNOSIS — M542 Cervicalgia: Secondary | ICD-10-CM | POA: Diagnosis not present

## 2024-06-24 DIAGNOSIS — Z7689 Persons encountering health services in other specified circumstances: Secondary | ICD-10-CM

## 2024-06-24 DIAGNOSIS — R252 Cramp and spasm: Secondary | ICD-10-CM | POA: Diagnosis not present

## 2024-06-24 DIAGNOSIS — M545 Low back pain, unspecified: Secondary | ICD-10-CM | POA: Diagnosis not present

## 2024-06-24 DIAGNOSIS — G8929 Other chronic pain: Secondary | ICD-10-CM

## 2024-06-24 DIAGNOSIS — M1711 Unilateral primary osteoarthritis, right knee: Secondary | ICD-10-CM

## 2024-06-24 DIAGNOSIS — R6889 Other general symptoms and signs: Secondary | ICD-10-CM
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
## 2024-06-25 ENCOUNTER — Ambulatory Visit: Payer: Self-pay | Admitting: Family Medicine

## 2024-06-25 LAB — CBC WITH DIFFERENTIAL/PLATELET
Basophils Absolute: 0 x10E3/uL (ref 0.0–0.2)
Basos: 0 %
EOS (ABSOLUTE): 0.1 x10E3/uL (ref 0.0–0.4)
Eos: 2 %
Hematocrit: 40.1 % (ref 34.0–46.6)
Hemoglobin: 13.1 g/dL (ref 11.1–15.9)
Immature Grans (Abs): 0 x10E3/uL (ref 0.0–0.1)
Immature Granulocytes: 0 %
Lymphocytes Absolute: 1.8 x10E3/uL (ref 0.7–3.1)
Lymphs: 32 %
MCH: 31.7 pg (ref 26.6–33.0)
MCHC: 32.7 g/dL (ref 31.5–35.7)
MCV: 97 fL (ref 79–97)
Monocytes Absolute: 0.4 x10E3/uL (ref 0.1–0.9)
Monocytes: 6 %
Neutrophils Absolute: 3.4 x10E3/uL (ref 1.4–7.0)
Neutrophils: 60 %
Platelets: 254 x10E3/uL (ref 150–450)
RBC: 4.13 x10E6/uL (ref 3.77–5.28)
RDW: 12 % (ref 11.7–15.4)
WBC: 5.7 x10E3/uL (ref 3.4–10.8)

## 2024-06-25 LAB — BASIC METABOLIC PANEL WITH GFR
BUN/Creatinine Ratio: 10 (ref 9–23)
BUN: 11 mg/dL (ref 6–24)
CO2: 21 mmol/L (ref 20–29)
Calcium: 9.1 mg/dL (ref 8.7–10.2)
Chloride: 102 mmol/L (ref 96–106)
Creatinine, Ser: 1.06 mg/dL — ABNORMAL HIGH (ref 0.57–1.00)
Glucose: 91 mg/dL (ref 70–99)
Potassium: 4.4 mmol/L (ref 3.5–5.2)
Sodium: 136 mmol/L (ref 134–144)
eGFR: 62 mL/min/1.73 (ref >59)

## 2024-06-25 LAB — TSH+FREE T4
Free T4: 1.22 ng/dL (ref 0.82–1.77)
TSH: 0.65 u[IU]/mL (ref 0.450–4.500)

## 2024-10-14 ENCOUNTER — Ambulatory Visit: Payer: Self-pay
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
## 2024-10-15 ENCOUNTER — Ambulatory Visit (INDEPENDENT_AMBULATORY_CARE_PROVIDER_SITE_OTHER): Payer: PRIVATE HEALTH INSURANCE | Admitting: Urgent Care

## 2024-10-15 ENCOUNTER — Encounter: Payer: Self-pay | Admitting: Urgent Care

## 2024-10-15 VITALS — BP 114/70 | HR 74 | Ht 68.0 in | Wt 178.0 lb

## 2024-10-15 DIAGNOSIS — M7732 Calcaneal spur, left foot: Secondary | ICD-10-CM

## 2024-10-15 DIAGNOSIS — M25511 Pain in right shoulder: Secondary | ICD-10-CM

## 2024-10-15 DIAGNOSIS — M2142 Flat foot [pes planus] (acquired), left foot: Secondary | ICD-10-CM

## 2024-10-15 DIAGNOSIS — Z23 Encounter for immunization: Secondary | ICD-10-CM | POA: Diagnosis not present

## 2024-10-15 DIAGNOSIS — M76899 Other specified enthesopathies of unspecified lower limb, excluding foot: Secondary | ICD-10-CM

## 2024-10-15 DIAGNOSIS — M722 Plantar fascial fibromatosis: Secondary | ICD-10-CM

## 2024-10-15 DIAGNOSIS — M2141 Flat foot [pes planus] (acquired), right foot: Secondary | ICD-10-CM | POA: Diagnosis not present

## 2024-10-15 DIAGNOSIS — M7731 Calcaneal spur, right foot: Secondary | ICD-10-CM

## 2024-10-15 DIAGNOSIS — G8929 Other chronic pain: Secondary | ICD-10-CM

## 2024-10-15 DIAGNOSIS — M7918 Myalgia, other site: Secondary | ICD-10-CM

## 2024-10-15 MED ORDER — DICLOFENAC SODIUM 75 MG PO TBEC: 75.0000 mg | DELAYED_RELEASE_TABLET | Freq: Two times a day (BID) | ORAL | 0 refills | Status: DC

## 2024-10-15 MED ORDER — BACLOFEN 10 MG PO TABS: 10.0000 mg | ORAL_TABLET | Freq: Three times a day (TID) | ORAL | 0 refills | Status: DC
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
## 2024-10-15 NOTE — Patient Instructions (Signed)
 You have a trigger point, which is knots in the muscles of the shoulder. Please apply a warm moist compress, such as a microwavable heating pack, to your neck several times daily. After each warm compress, apply the technique that we discussed today of ischemic release. This is a prolonged, deep pressure into the knot of the muscle to release the tension.  Purchase this device to help with your shoulder pain.  Take the muscle relaxer three times daily as needed. Keep in mind it may make you feel tired or drowsy, so do not operate machinery or drive a car until you know how it affects you.  Please take the anti-inflammatory medication called in today twice daily with food. Do not take any additional OTC NSAIDS (advil , motrin , ibuprofen , aleve, naproxen).    If your symptoms persist, you would be a candidate for trigger point injection or dry needling.  Try to stay hydrated with WATER as dehydration and caffeine intake can worsen this condition.  Please do the exercises attached to this form.  Use the heel cups in your shoes. If symptoms persist, will refer to podiatry.

## 2024-12-27 ENCOUNTER — Ambulatory Visit: Payer: PRIVATE HEALTH INSURANCE | Admitting: Family Medicine

## 2024-12-27 ENCOUNTER — Telehealth: Payer: PRIVATE HEALTH INSURANCE | Admitting: Family Medicine

## 2024-12-27 ENCOUNTER — Encounter: Payer: Self-pay | Admitting: Family Medicine

## 2024-12-27 VITALS — Wt 176.0 lb

## 2024-12-27 DIAGNOSIS — M25561 Pain in right knee: Secondary | ICD-10-CM

## 2024-12-27 DIAGNOSIS — G8929 Other chronic pain: Secondary | ICD-10-CM

## 2024-12-27 NOTE — Progress Notes (Signed)
 Virtual Visit via Video Note  I connected with Dawn Acevedo on 12/27/24 at  3:50 PM EST by a video enabled telemedicine application and verified that I am speaking with the correct person using two identifiers.  Location: Patient: Home in [place] Provider: Home in [place] [facility])   I discussed the limitations of evaluation and management by telemedicine and the availability of in person appointments. The patient expressed understanding and agreed to proceed.  History of Present Illness: Pt had issues with her right knee. She was given Diclofenac  and baclofen  back in November. She says she had issues back in 2020-2021. She was to have right knee surgery in 2024 by Dr Melodi for knee arthroscopy and meniscal debridement. She says she hasn't had any issues with her knee since November 2025.   Observations/Objective: Physical Exam Vitals and nursing note reviewed.  Constitutional:      Appearance: Normal appearance. She is normal weight.  HENT:     Head: Normocephalic and atraumatic.     Right Ear: External ear normal.     Left Ear: External ear normal.  Pulmonary:     Effort: Pulmonary effort is normal.  Neurological:     General: No focal deficit present.     Mental Status: She is alert and oriented to person, place, and time. Mental status is at baseline.  Psychiatric:        Mood and Affect: Mood normal.        Behavior: Behavior normal.        Thought Content: Thought content normal.        Judgment: Judgment normal.     Assessment and Plan: Chronic pain of right knee     Follow Up Instructions:   pt with chronic knee pain. Doing well now. To monitor for now. No other issues voiced. To see back for CPE soon  I discussed the assessment and treatment plan with the patient. The patient was provided an opportunity to ask questions and all were answered. The patient agreed with the plan and demonstrated an understanding of the instructions.   The patient was advised to call back  or seek an in-person evaluation if the symptoms worsen or if the condition fails to improve as anticipated.  I provided 3 minutes of non-face-to-face time during this encounter.   Torrence CINDERELLA Barrier, MD
